# Patient Record
Sex: Male | Born: 1935 | Race: White | Hispanic: No | Marital: Married | Smoking: Never smoker
Health system: Southern US, Community
[De-identification: ages and names within clinical notes are randomized; demographics above are authoritative.]

## PROBLEM LIST (undated history)

## (undated) DIAGNOSIS — N200 Calculus of kidney: Secondary | ICD-10-CM

## (undated) DIAGNOSIS — H269 Unspecified cataract: Secondary | ICD-10-CM

## (undated) DIAGNOSIS — Z87442 Personal history of urinary calculi: Secondary | ICD-10-CM

## (undated) DIAGNOSIS — M1990 Unspecified osteoarthritis, unspecified site: Secondary | ICD-10-CM

## (undated) DIAGNOSIS — I10 Essential (primary) hypertension: Secondary | ICD-10-CM

## (undated) DIAGNOSIS — C61 Malignant neoplasm of prostate: Secondary | ICD-10-CM

## (undated) DIAGNOSIS — E785 Hyperlipidemia, unspecified: Secondary | ICD-10-CM

## (undated) DIAGNOSIS — E119 Type 2 diabetes mellitus without complications: Secondary | ICD-10-CM

## (undated) DIAGNOSIS — E039 Hypothyroidism, unspecified: Secondary | ICD-10-CM

## (undated) DIAGNOSIS — G629 Polyneuropathy, unspecified: Secondary | ICD-10-CM

## (undated) DIAGNOSIS — K4090 Unilateral inguinal hernia, without obstruction or gangrene, not specified as recurrent: Secondary | ICD-10-CM

## (undated) HISTORY — DX: Unspecified cataract: H26.9

## (undated) HISTORY — PX: KIDNEY STONE SURGERY: SHX686

## (undated) HISTORY — PX: VASECTOMY: SHX75

## (undated) HISTORY — PX: COLONOSCOPY: SHX174

## (undated) HISTORY — PX: INNER EAR SURGERY: SHX679

## (undated) HISTORY — PX: PROSTATE BIOPSY: SHX241

## (undated) HISTORY — PX: HERNIA REPAIR: SHX51

## (undated) HISTORY — PX: EYE SURGERY: SHX253

---

## 2000-01-14 ENCOUNTER — Emergency Department (HOSPITAL_COMMUNITY): Admission: EM | Admit: 2000-01-14 | Discharge: 2000-01-14 | Payer: Self-pay | Admitting: *Deleted

## 2000-08-22 ENCOUNTER — Encounter: Payer: Self-pay | Admitting: Specialist

## 2000-08-22 ENCOUNTER — Encounter: Admission: RE | Admit: 2000-08-22 | Discharge: 2000-08-22 | Payer: Self-pay | Admitting: Specialist

## 2007-05-07 ENCOUNTER — Ambulatory Visit: Payer: Self-pay | Admitting: Internal Medicine

## 2007-05-21 ENCOUNTER — Encounter: Payer: Self-pay | Admitting: Internal Medicine

## 2007-05-21 ENCOUNTER — Ambulatory Visit: Payer: Self-pay | Admitting: Internal Medicine

## 2008-03-27 ENCOUNTER — Emergency Department (HOSPITAL_COMMUNITY): Admission: EM | Admit: 2008-03-27 | Discharge: 2008-03-27 | Payer: Self-pay | Admitting: Emergency Medicine

## 2008-04-13 ENCOUNTER — Inpatient Hospital Stay (HOSPITAL_COMMUNITY): Admission: EM | Admit: 2008-04-13 | Discharge: 2008-04-14 | Payer: Self-pay | Admitting: Emergency Medicine

## 2009-10-11 DIAGNOSIS — G629 Polyneuropathy, unspecified: Secondary | ICD-10-CM

## 2009-10-11 DIAGNOSIS — E119 Type 2 diabetes mellitus without complications: Secondary | ICD-10-CM

## 2010-10-05 ENCOUNTER — Other Ambulatory Visit: Payer: Self-pay | Admitting: Dermatology

## 2011-01-22 NOTE — Op Note (Signed)
NAME:  Craig Jensen, VALVANO NO.:  [id number]   MEDICAL RECORD NO.:  [id number]          PATIENT TYPE:  INP   LOCATION:  1429                         FACILITY:  [facility]   PHYSICIAN:  Valetta Fuller, M.D.  DATE OF BIRTH:  03/02/1930   DATE OF PROCEDURE:  04/13/2008  DATE OF DISCHARGE:                               OPERATIVE REPORT   PREOPERATIVE DIAGNOSIS:  Left ureteral calculus.   POSTOPERATIVE DIAGNOSIS:  Left ureteral calculus.   PROCEDURE PERFORMED:  Cystoscopy, left retrograde pyelography, left  ureteroscopy, left-sided holmium laser lithotripsy, with left double-J  stent placement, and insertion of Foley catheter.   SURGEON:  Valetta Fuller, M.D.   ANESTHESIA:  General.   INDICATIONS:  [name] is a 77 year old male.  He generally enjoys  good health as had no major systemic medical illnesses, nor has he had  any previous urologic history.  The patient had presented to the [facility] Emergency Room approximately 2 weeks ago.  At that time, he had had  typical left-sided renal colic.  A CT scan demonstrated a 5 mm  calcification in his midureter.  The patient's pain resolved, and he was  discharged home.  That patient subsequently was asymptomatic for  approximately 2 weeks.  Yesterday, he developed recurrent severe renal  colic and re-presented to the [facility] Emergency Room.  There, his  pain was initially controlled but then became much more severe.  We saw  him in consultation.  At that time, he was having significant ongoing  nausea and significant renal colic.  For that reason, he was admitted  for observation.  We discussed options with him, and he wanted to  proceed with definitive management.  This morning, he was continuing to  have significant renal colic, at least intermittently.  We discussed the  pros and cons of a ureteroscopic approach, and he elected to proceed  with.  He appeared to understand the advantages and disadvantages and  potential complications.   TECHNIQUE AND FINDINGS:  The patient was brought to the operating room,  where he had successful induction of general anesthesia.  He was placed  in the lithotomy position and prepped and draped in the usual manner.  Cystoscopy revealed an unremarkable anterior urethra, with moderate  trilobar hyperplasia and a very high-riding median bar.  Cystoscopically, no other abnormalities were appreciated.  Left-sided  retrograde pyelogram was performed with an 8-French cone-tipped catheter  with fluoroscopic guidance.  A filling defect was noted in the distal  ureter just 1 cm or so above the ureterovesical junction.   A guidewire was all placed with some manipulation beyond the stone into  the left renal pelvis.  Initial attempts to engage the distal ureter  with the rigid ureteroscope were unsuccessful due to significant  narrowing and inflammation.  For that reason, over the guidewire we used  the inside portion of an access sheath to provide one-step dilation.  We  were then easily able to engage the ureteroscope, and a 4 x 6 mm stone  was encountered in the distal ureter.  Holmium laser lithotriptor was  used  to fragment the stone into approximately 5 pieces.  The largest 3-4  pieces were basket extracted and will be sent for analysis.  We elected  to place a 6-French 24 cm stent over a guidewire with fluoroscopic as  well as visual guidance.  The patient did have some ongoing hematuria,  primarily due to some bleeding from friable vessels at his median  bar/bladder neck.  For that reason, we went ahead and place  a Foley catheter, and his urine was light pink at the completion of the  procedure.  Lidocaine jelly was instilled, as was a B & O suppository.  No obvious complications occurred, and the patient appeared to tolerate  the procedure well.  He was brought to the recovery room in stable  condition.      Valetta Fuller, M.D.  Electronically  Signed     DSG/MEDQ  D:  04/13/2008  T:  04/13/2008  Job:  [id number]

## 2011-06-07 LAB — CBC
HCT: 43.2
HCT: 39.2
Hemoglobin: 14.5
Hemoglobin: 13.3
MCHC: 33.6
MCHC: 33.9
MCV: 92.3
MCV: 93.7
Platelets: 204
Platelets: 204
RBC: 4.68
RBC: 4.18 — ABNORMAL LOW
RDW: 13.4
RDW: 13.5
WBC: 9.2
WBC: 10.5

## 2011-06-07 LAB — POCT I-STAT, CHEM 8
BUN: 17
Calcium, Ion: 1.21
Chloride: 106
Creatinine, Ser: 1.1
Glucose, Bld: 193 — ABNORMAL HIGH
HCT: 43
Hemoglobin: 14.6
Potassium: 4.2
Sodium: 141
TCO2: 27

## 2011-06-07 LAB — URINALYSIS, ROUTINE W REFLEX MICROSCOPIC
Bilirubin Urine: NEGATIVE
Bilirubin Urine: NEGATIVE
Glucose, UA: NEGATIVE
Glucose, UA: NEGATIVE
Ketones, ur: >80 — AB
Nitrite: NEGATIVE
Nitrite: NEGATIVE
Protein, ur: NEGATIVE
Protein, ur: 30 — AB
Specific Gravity, Urine: 1.023
Specific Gravity, Urine: 1.024
Urobilinogen, UA: 0.2
Urobilinogen, UA: 0.2
pH: 5.5
pH: 8.5 — ABNORMAL HIGH

## 2011-06-07 LAB — BASIC METABOLIC PANEL
BUN: 19
BUN: 19
CO2: 24
CO2: 28
Calcium: 8.4
Calcium: 8.6
Chloride: 108
Chloride: 110
Creatinine, Ser: 1.24
Creatinine, Ser: 1.45
GFR calc Af Amer: 57 — ABNORMAL LOW
GFR calc Af Amer: >60
GFR calc non Af Amer: 47 — ABNORMAL LOW
GFR calc non Af Amer: 57 — ABNORMAL LOW
Glucose, Bld: 161 — ABNORMAL HIGH
Glucose, Bld: 164 — ABNORMAL HIGH
Potassium: 4.3
Potassium: 4.5
Sodium: 142
Sodium: 143

## 2011-06-07 LAB — URINE MICROSCOPIC-ADD ON

## 2011-06-07 LAB — DIFFERENTIAL
Basophils Absolute: 0
Basophils Relative: 0
Eosinophils Absolute: 0
Eosinophils Relative: 0
Lymphocytes Relative: 13
Lymphs Abs: 1.4
Monocytes Absolute: 1
Monocytes Relative: 10
Neutro Abs: 8.1 — ABNORMAL HIGH
Neutrophils Relative %: 77

## 2012-05-07 ENCOUNTER — Encounter: Payer: Self-pay | Admitting: Internal Medicine

## 2012-12-28 ENCOUNTER — Encounter: Payer: Self-pay | Admitting: Physician Assistant

## 2012-12-28 ENCOUNTER — Ambulatory Visit (INDEPENDENT_AMBULATORY_CARE_PROVIDER_SITE_OTHER): Payer: Medicare Other | Admitting: Emergency Medicine

## 2012-12-28 VITALS — BP 158/76 | HR 70 | Temp 98.1°F | Resp 18 | Ht 71.5 in | Wt 226.0 lb

## 2012-12-28 DIAGNOSIS — S0990XA Unspecified injury of head, initial encounter: Secondary | ICD-10-CM

## 2012-12-28 DIAGNOSIS — S0101XA Laceration without foreign body of scalp, initial encounter: Secondary | ICD-10-CM

## 2012-12-28 DIAGNOSIS — S0100XA Unspecified open wound of scalp, initial encounter: Secondary | ICD-10-CM

## 2012-12-28 NOTE — Progress Notes (Signed)
Patient ID: JIBRAN CROOKSHANKS MRN: [id number], DOB: Feb 23, 1930, 79 y.o. Date of Encounter: 12/28/2012, 12:29 PM   PROCEDURE NOTE: Verbal consent obtained. Sterile technique employed. Numbing: Anesthesia obtained with 1% lidocaine with epinephrine.   Cleansed with soap and water. Irrigated.  Wound explored, no deep structures involved, no foreign bodies.   Wound repaired with # 7 SI and #1 purse-string suture.   Hemostasis obtained. Wound cleansed and dressed.  Wound care instructions including precautions covered with patient. Handout given.  Anticipate suture removal in 7-10 days  Rhoderick Moody, PA-C 12/28/2012 12:29 PM

## 2012-12-28 NOTE — Patient Instructions (Signed)

## 2012-12-28 NOTE — Progress Notes (Signed)
[facility] [address] [place] [address] [phone]  Date:  12/28/2012   Name:  Craig Jensen   DOB:  1929-10-20   MRN:  [id number]  PCP:  Minda Meo, MD    Chief Complaint: wound head   History of Present Illness:  Craig Jensen is a 79 y.o. very pleasant male patient who presents with the following:  Standing in a parking lot pulling on a steel post that was embedded in the ground.  The post gave way suddenly and he fell backwards striking his head on the pavement.  He had no LOC.  No neuro or visual symptoms.  Not on anticoagulant.  Not certain about tetanus status.  Denies other complaint or health concern today.   There is no problem list on file for this patient.   Past Medical History  Diagnosis Date  . Cataract     Past Surgical History  Procedure Laterality Date  . Eye surgery    . Hernia repair    . Joint replacement    . Vasectomy      History  Substance Use Topics  . Smoking status: Never Smoker   . Smokeless tobacco: Not on file  . Alcohol Use: No    Family History  Problem Relation Age of Onset  . Cancer Mother   . Stroke Mother   . Heart disease Father   . Stroke Brother     No Known Allergies  Medication list has been reviewed and updated.  No current outpatient prescriptions on file prior to visit.   No current facility-administered medications on file prior to visit.    Review of Systems:  As per HPI, otherwise negative.    Physical Examination: Filed Vitals:   12/28/12 1133  BP: 158/76  Pulse: 70  Temp: 98.1 F (36.7 C)  Resp: 18   Filed Vitals:   12/28/12 1133  Height: 5' 11.5" (1.816 m)  Weight: 226 lb (102.513 kg)   Body mass index is 31.08 kg/(m^2). Ideal Body Weight: Weight in (lb) to have BMI = 25: 181.4   GEN: WDWN, NAD, Non-toxic, Alert & Oriented x 3 HEENT: laceration occiput, stellate.  No FB.  , Normocephalic.   PRRERLA EOMI BATTLE Negative.   Ears and Nose:  No external deformity. EXTR: No clubbing/cyanosis/edema NEURO: Normal gait.  PSYCH: Normally interactive. Conversant. Not depressed or anxious appearing.  Calm demeanor.  Neck supple not tender  Assessment and Plan: Laceration scalp  Signed,  Phillips Odor, MD

## 2013-01-05 ENCOUNTER — Ambulatory Visit (INDEPENDENT_AMBULATORY_CARE_PROVIDER_SITE_OTHER): Payer: Medicare Other | Admitting: Internal Medicine

## 2013-01-05 VITALS — BP 178/93 | HR 88 | Temp 98.4°F | Resp 18

## 2013-01-05 DIAGNOSIS — R519 Headache, unspecified: Secondary | ICD-10-CM

## 2013-01-05 DIAGNOSIS — R51 Headache: Secondary | ICD-10-CM

## 2013-01-05 DIAGNOSIS — S0100XD Unspecified open wound of scalp, subsequent encounter: Secondary | ICD-10-CM

## 2013-01-05 DIAGNOSIS — Z5189 Encounter for other specified aftercare: Secondary | ICD-10-CM

## 2013-01-05 NOTE — Progress Notes (Signed)
  Subjective:    Patient ID: Craig Jensen, male    DOB: 1930/04/06, 79 y.o.   MRN: [id number]  HPI Tender wound on scalp.   Review of Systems     Objective:   Physical Exam  Wound dry and healed but tender      Assessment & Plan:  Wound healed/Remove sutures Pain care

## 2013-01-07 ENCOUNTER — Encounter: Payer: Self-pay | Admitting: Internal Medicine

## 2015-10-29 ENCOUNTER — Emergency Department (HOSPITAL_BASED_OUTPATIENT_CLINIC_OR_DEPARTMENT_OTHER)
Admission: EM | Admit: 2015-10-29 | Discharge: 2015-10-29 | Disposition: A | Discharge status: Home / Self Care | Payer: Medicare Other | Attending: Emergency Medicine | Admitting: Emergency Medicine

## 2015-10-29 ENCOUNTER — Encounter (HOSPITAL_BASED_OUTPATIENT_CLINIC_OR_DEPARTMENT_OTHER): Payer: Self-pay | Admitting: Emergency Medicine

## 2015-10-29 ENCOUNTER — Emergency Department (HOSPITAL_BASED_OUTPATIENT_CLINIC_OR_DEPARTMENT_OTHER): Payer: Medicare Other

## 2015-10-29 DIAGNOSIS — W1843XA Slipping, tripping and stumbling without falling due to stepping from one level to another, initial encounter: Secondary | ICD-10-CM | POA: Diagnosis not present

## 2015-10-29 DIAGNOSIS — Z8669 Personal history of other diseases of the nervous system and sense organs: Secondary | ICD-10-CM | POA: Diagnosis not present

## 2015-10-29 DIAGNOSIS — S79911A Unspecified injury of right hip, initial encounter: Secondary | ICD-10-CM | POA: Diagnosis present

## 2015-10-29 DIAGNOSIS — Y93K1 Activity, walking an animal: Secondary | ICD-10-CM | POA: Insufficient documentation

## 2015-10-29 DIAGNOSIS — S79912A Unspecified injury of left hip, initial encounter: Secondary | ICD-10-CM | POA: Diagnosis not present

## 2015-10-29 DIAGNOSIS — Y9289 Other specified places as the place of occurrence of the external cause: Secondary | ICD-10-CM | POA: Insufficient documentation

## 2015-10-29 DIAGNOSIS — M5431 Sciatica, right side: Secondary | ICD-10-CM | POA: Diagnosis not present

## 2015-10-29 DIAGNOSIS — Y998 Other external cause status: Secondary | ICD-10-CM | POA: Diagnosis not present

## 2015-10-29 HISTORY — DX: Polyneuropathy, unspecified: G62.9

## 2015-10-29 HISTORY — DX: Type 2 diabetes mellitus without complications: E11.9

## 2015-10-29 HISTORY — DX: Hyperlipidemia, unspecified: E78.5

## 2015-10-29 HISTORY — DX: Unilateral inguinal hernia, without obstruction or gangrene, not specified as recurrent: K40.90

## 2015-10-29 HISTORY — DX: Unspecified osteoarthritis, unspecified site: M19.90

## 2015-10-29 MED ORDER — PREDNISONE 10 MG PO TABS: 20.0000 mg | ORAL_TABLET | Freq: Two times a day (BID) | ORAL | Status: DC

## 2015-10-29 MED ORDER — HYDROCODONE-ACETAMINOPHEN 5-325 MG PO TABS
2.0000 | ORAL_TABLET | Freq: Once | ORAL | Status: AC
  Administered 2015-10-29: 2 via ORAL
  Filled 2015-10-29: qty 2

## 2015-10-29 MED ORDER — HYDROCODONE-ACETAMINOPHEN 5-325 MG PO TABS: 1.0000 | ORAL_TABLET | Freq: Four times a day (QID) | ORAL | Status: DC | PRN

## 2015-10-29 NOTE — ED Notes (Signed)
MD with pt  

## 2015-10-29 NOTE — ED Notes (Signed)
MD at bedside. 

## 2015-10-29 NOTE — ED Notes (Signed)
Pain in right hip x2 days.  Sts he stepped in a depression on the ground 10 days ago while walking the dog and he had pain in his LEFT hip for a week.  Two days ago he started having severe pain in his RIGHT hip that is worse than the left hip pain was and is keeping him up at night.

## 2015-10-29 NOTE — Discharge Instructions (Signed)
Prednisone as prescribed.  Hydrocodone as prescribed as needed for pain.  Follow-up with your primary Dr. if not improving in the next week, and return to the ER if symptoms significantly worsen or change.   Sciatica Sciatica is pain, weakness, numbness, or tingling along the path of the sciatic nerve. The nerve starts in the lower back and runs down the back of each leg. The nerve controls the muscles in the lower leg and in the back of the knee, while also providing sensation to the back of the thigh, lower leg, and the sole of your foot. Sciatica is a symptom of another medical condition. For instance, nerve damage or certain conditions, such as a herniated disk or bone spur on the spine, pinch or put pressure on the sciatic nerve. This causes the pain, weakness, or other sensations normally associated with sciatica. Generally, sciatica only affects one side of the body. CAUSES   Herniated or slipped disc.  Degenerative disk disease.  A pain disorder involving the narrow muscle in the buttocks (piriformis syndrome).  Pelvic injury or fracture.  Pregnancy.  Tumor (rare). SYMPTOMS  Symptoms can vary from mild to very severe. The symptoms usually travel from the low back to the buttocks and down the back of the leg. Symptoms can include:  Mild tingling or dull aches in the lower back, leg, or hip.  Numbness in the back of the calf or sole of the foot.  Burning sensations in the lower back, leg, or hip.  Sharp pains in the lower back, leg, or hip.  Leg weakness.  Severe back pain inhibiting movement. These symptoms may get worse with coughing, sneezing, laughing, or prolonged sitting or standing. Also, being overweight may worsen symptoms. DIAGNOSIS  Your caregiver will perform a physical exam to look for common symptoms of sciatica. He or she may ask you to do certain movements or activities that would trigger sciatic nerve pain. Other tests may be performed to find the cause of  the sciatica. These may include:  Blood tests.  X-rays.  Imaging tests, such as an MRI or CT scan. TREATMENT  Treatment is directed at the cause of the sciatic pain. Sometimes, treatment is not necessary and the pain and discomfort goes away on its own. If treatment is needed, your caregiver may suggest:  Over-the-counter medicines to relieve pain.  Prescription medicines, such as anti-inflammatory medicine, muscle relaxants, or narcotics.  Applying heat or ice to the painful area.  Steroid injections to lessen pain, irritation, and inflammation around the nerve.  Reducing activity during periods of pain.  Exercising and stretching to strengthen your abdomen and improve flexibility of your spine. Your caregiver may suggest losing weight if the extra weight makes the back pain worse.  Physical therapy.  Surgery to eliminate what is pressing or pinching the nerve, such as a bone spur or part of a herniated disk. HOME CARE INSTRUCTIONS   Only take over-the-counter or prescription medicines for pain or discomfort as directed by your caregiver.  Apply ice to the affected area for 20 minutes, 3-4 times a day for the first 48-72 hours. Then try heat in the same way.  Exercise, stretch, or perform your usual activities if these do not aggravate your pain.  Attend physical therapy sessions as directed by your caregiver.  Keep all follow-up appointments as directed by your caregiver.  Do not wear high heels or shoes that do not provide proper support.  Check your mattress to see if it is too  soft. A firm mattress may lessen your pain and discomfort. SEEK IMMEDIATE MEDICAL CARE IF:   You lose control of your bowel or bladder (incontinence).  You have increasing weakness in the lower back, pelvis, buttocks, or legs.  You have redness or swelling of your back.  You have a burning sensation when you urinate.  You have pain that gets worse when you lie down or awakens you at  night.  Your pain is worse than you have experienced in the past.  Your pain is lasting longer than 4 weeks.  You are suddenly losing weight without reason. MAKE SURE YOU:  Understand these instructions.  Will watch your condition.  Will get help right away if you are not doing well or get worse.   This information is not intended to replace advice given to you by your health care provider. Make sure you discuss any questions you have with your health care provider.   Document Released: 08/20/2001 Document Revised: 05/17/2015 Document Reviewed: 01/05/2012 Elsevier Interactive Patient Education [employer].

## 2015-10-29 NOTE — ED Provider Notes (Signed)
CSN: [id number]     Arrival date & time 10/29/15  0402 History   First MD Initiated Contact with Patient 10/29/15 [phone]     Chief Complaint  Patient presents with  . Hip Pain     (Consider location/radiation/quality/duration/timing/severity/associated sxs/prior Treatment) HPI Comments: Patient is an 82 year old male with past medical history of arthritis and cataracts. He presents for evaluation of right hip pain. He reports still pretty awkwardly on uneven ground one week ago. He had pain in his left hip which gradually improved, Justice and is the left hip pain was improving he began experiencing worsening pain in his right hip. He denies any new injury or trauma. He is able to ambulate, however is having discomfort. He reports radiation to the back of his leg but denies any weakness, numbness, or bowel or bladder complaints.  Patient is a 82 y.o. male presenting with hip pain. The history is provided by the patient.  Hip Pain This is a new problem. The current episode started 2 days ago. The problem occurs constantly. The problem has been rapidly worsening. The symptoms are aggravated by walking. Nothing relieves the symptoms. Treatments tried: Ibuprofen. The treatment provided mild relief.    Past Medical History  Diagnosis Date  . Cataract   . Arthritis    Past Surgical History  Procedure Laterality Date  . Eye surgery    . Hernia repair    . Joint replacement    . Vasectomy     Family History  Problem Relation Age of Onset  . Cancer Mother   . Stroke Mother   . Heart disease Father   . Stroke Brother    Social History  Substance Use Topics  . Smoking status: Never Smoker   . Smokeless tobacco: None  . Alcohol Use: No    Review of Systems  All other systems reviewed and are negative.     Allergies  Review of patient's allergies indicates no known allergies.  Home Medications   Prior to Admission medications   Not on File   BP 208/94 mmHg  Pulse 64   Temp(Src) 98.3 F (36.8 C) (Oral)  Resp 16  Ht 6\' 1"  (1.854 m)  Wt 220 lb (99.791 kg)  BMI 29.03 kg/m2  SpO2 98% Physical Exam  Constitutional: He is oriented to person, place, and time. He appears well-developed and well-nourished. No distress.  HENT:  Head: Normocephalic and atraumatic.  Neck: Normal range of motion. Neck supple.  Musculoskeletal:  The right hip appears grossly normal. There is no shortening or rotation of the right leg. There is tenderness in the right buttock area.  Neurological: He is alert and oriented to person, place, and time.  Strength is 5 out of 5 in the bilateral lower extremities. He is ambulatory with discomfort.  Skin: Skin is warm and dry. He is not diaphoretic.  Nursing note and vitals reviewed.   ED Course  Procedures (including critical care time) Labs Review Labs Reviewed - No data to display  Imaging Review No results found. I have personally reviewed and evaluated these images and lab results as part of my medical decision-making.    MDM   Final diagnoses:  None    Patient presents here with pain in his right hip. His tender to palpation over the buttock. I highly suspect this is sciatica. Was given hydrocodone without much relief. He will be discharged with prednisone and hydrocodone. His x-rays are negative.    Veryl Speak, MD 10/29/15 [phone]

## 2015-11-28 ENCOUNTER — Other Ambulatory Visit: Payer: Self-pay | Admitting: Internal Medicine

## 2015-11-28 DIAGNOSIS — G959 Disease of spinal cord, unspecified: Secondary | ICD-10-CM

## 2015-12-05 ENCOUNTER — Other Ambulatory Visit: Payer: Self-pay | Admitting: Internal Medicine

## 2015-12-05 ENCOUNTER — Other Ambulatory Visit: Payer: Self-pay | Admitting: Orthopedic Surgery

## 2015-12-05 DIAGNOSIS — M19011 Primary osteoarthritis, right shoulder: Secondary | ICD-10-CM

## 2015-12-05 DIAGNOSIS — M545 Low back pain: Secondary | ICD-10-CM

## 2015-12-07 ENCOUNTER — Other Ambulatory Visit: Payer: Medicare Other

## 2015-12-08 ENCOUNTER — Ambulatory Visit
Admission: RE | Admit: 2015-12-08 | Discharge: 2015-12-08 | Disposition: A | Discharge status: Home / Self Care | Payer: Medicare Other | Source: Ambulatory Visit | Attending: Orthopedic Surgery | Admitting: Orthopedic Surgery

## 2015-12-08 ENCOUNTER — Ambulatory Visit
Admission: RE | Admit: 2015-12-08 | Discharge: 2015-12-08 | Disposition: A | Discharge status: Home / Self Care | Payer: Medicare Other | Source: Ambulatory Visit | Attending: Internal Medicine | Admitting: Internal Medicine

## 2015-12-08 DIAGNOSIS — M545 Low back pain: Secondary | ICD-10-CM

## 2015-12-08 DIAGNOSIS — M19011 Primary osteoarthritis, right shoulder: Secondary | ICD-10-CM

## 2016-01-05 ENCOUNTER — Other Ambulatory Visit: Payer: Self-pay

## 2016-01-05 ENCOUNTER — Encounter (HOSPITAL_COMMUNITY)
Admission: RE | Admit: 2016-01-05 | Discharge: 2016-01-05 | Disposition: A | Discharge status: Home / Self Care | Payer: Medicare Other | Source: Ambulatory Visit | Attending: Orthopedic Surgery | Admitting: Orthopedic Surgery

## 2016-01-05 ENCOUNTER — Other Ambulatory Visit (HOSPITAL_COMMUNITY): Payer: Self-pay | Admitting: *Deleted

## 2016-01-05 ENCOUNTER — Encounter (HOSPITAL_COMMUNITY): Payer: Self-pay

## 2016-01-05 DIAGNOSIS — M13811 Other specified arthritis, right shoulder: Secondary | ICD-10-CM | POA: Diagnosis not present

## 2016-01-05 DIAGNOSIS — Z01812 Encounter for preprocedural laboratory examination: Secondary | ICD-10-CM | POA: Diagnosis not present

## 2016-01-05 DIAGNOSIS — E119 Type 2 diabetes mellitus without complications: Secondary | ICD-10-CM | POA: Insufficient documentation

## 2016-01-05 DIAGNOSIS — Z01818 Encounter for other preprocedural examination: Secondary | ICD-10-CM | POA: Insufficient documentation

## 2016-01-05 HISTORY — DX: Calculus of kidney: N20.0

## 2016-01-05 LAB — CBC
HCT: 42.2 % (ref 39.0–52.0)
Hemoglobin: 13.8 g/dL (ref 13.0–17.0)
MCH: 30.2 pg (ref 26.0–34.0)
MCHC: 32.7 g/dL (ref 30.0–36.0)
MCV: 92.3 fL (ref 78.0–100.0)
Platelets: 215 10*3/uL (ref 150–400)
RBC: 4.57 MIL/uL (ref 4.22–5.81)
RDW: 13.6 % (ref 11.5–15.5)
WBC: 7.4 10*3/uL (ref 4.0–10.5)

## 2016-01-05 LAB — BASIC METABOLIC PANEL
Anion gap: 7 (ref 5–15)
BUN: 15 mg/dL (ref 6–20)
CO2: 27 mmol/L (ref 22–32)
Calcium: 9.4 mg/dL (ref 8.9–10.3)
Chloride: 107 mmol/L (ref 101–111)
Creatinine, Ser: 0.85 mg/dL (ref 0.61–1.24)
GFR calc Af Amer: >60 mL/min (ref >60)
GFR calc non Af Amer: >60 mL/min (ref >60)
Glucose, Bld: 177 mg/dL — ABNORMAL HIGH (ref 65–99)
Potassium: 4.5 mmol/L (ref 3.5–5.1)
Sodium: 141 mmol/L (ref 135–145)

## 2016-01-05 LAB — SURGICAL PCR SCREEN
MRSA, PCR: NEGATIVE
Staphylococcus aureus: NEGATIVE

## 2016-01-05 NOTE — Progress Notes (Addendum)
Pt denies cardiac history, chest pain or sob.   Denies ever having an Echo, cath or stress test done.  Pt's PCP is Dr. Reynaldo Minium. Pt is diabetic - he states "borderline". Last A1C was 7.1 on 11/22/15. States fasting blood sugar is usually around 150  Medical Clearance from Dr. Reynaldo Minium is in chart.

## 2016-01-05 NOTE — Pre-Procedure Instructions (Signed)
Craig Jensen  01/05/2016     Your procedure is scheduled on Thursday, Jan 11, 2016 at 10:35 AM.   Report to [facility] Entrance "A" Admitting Office at 8:30 AM.   Call this number if you have problems the morning of surgery: [phone]   Any questions prior to day of surgery, please call [phone] between 8 & 4 PM.   Remember:  Do not eat food or drink liquids after midnight Wednesday, 01/10/16.    Do not wear jewelry.  Do not wear lotions, powders, or cologne.  You may NOT wear deodorant.  Do not shave 48 hours prior to surgery.  Men may shave face and neck.  Do not bring valuables to the hospital.  [facility] is not responsible for any belongings or valuables.  Contacts, dentures or bridgework may not be worn into surgery.  Leave your suitcase in the car.  After surgery it may be brought to your room.  For patients admitted to the hospital, discharge time will be determined by your treatment team.  Special instructions:  [name] - Preparing for Surgery  Before surgery, you can play an important role.  Because skin is not sterile, your skin needs to be as free of germs as possible.  You can reduce the number of germs on you skin by washing with CHG (chlorahexidine gluconate) soap before surgery.  CHG is an antiseptic cleaner which kills germs and bonds with the skin to continue killing germs even after washing.  Please DO NOT use if you have an allergy to CHG or antibacterial soaps.  If your skin becomes reddened/irritated stop using the CHG and inform your nurse when you arrive at Short Stay.  Do not shave (including legs and underarms) for at least 48 hours prior to the first CHG shower.  You may shave your face.  Please follow these instructions carefully:   1.  Shower with CHG Soap the night before surgery and the                                morning of Surgery.  2.  If you choose to wash your hair, wash your hair first as usual with your        normal shampoo.  3.  After you shampoo, rinse your hair and body thoroughly to remove the                      Shampoo.  4.  Use CHG as you would any other liquid soap.  You can apply chg directly       to the skin and wash gently with scrungie or a clean washcloth.  5.  Apply the CHG Soap to your body ONLY FROM THE NECK DOWN.        Do not use on open wounds or open sores.  Avoid contact with your eyes, ears, mouth and genitals (private parts).  Wash genitals (private parts) with your normal soap.  6.  Wash thoroughly, paying special attention to the area where your surgery        will be performed.  7.  Thoroughly rinse your body with warm water from the neck down.  8.  DO NOT shower/wash with your normal soap after using and rinsing off       the CHG Soap.  9.  Pat yourself dry with a clean towel.  10.  Wear clean pajamas.            11.  Place clean sheets on your bed the night of your first shower and do not        sleep with pets.  Day of Surgery  Do not apply any lotions/deodorants the morning of surgery.  Please wear clean clothes to the hospital.   Please read over the following fact sheets that you were given. Pain Booklet, Coughing and Deep Breathing, MRSA Information and Surgical Site Infection Prevention

## 2016-01-10 MED ORDER — TRANEXAMIC ACID 1000 MG/10ML IV SOLN
1000.0000 mg | INTRAVENOUS | Status: AC
  Administered 2016-01-11: 1000 mg via INTRAVENOUS
  Filled 2016-01-10: qty 10

## 2016-01-11 ENCOUNTER — Inpatient Hospital Stay (HOSPITAL_COMMUNITY)
Admission: RE | Admit: 2016-01-11 | Discharge: 2016-01-12 | DRG: 483 | Disposition: A | Discharge status: Home Health | Payer: Medicare Other | Source: Ambulatory Visit | Attending: Orthopedic Surgery | Admitting: Orthopedic Surgery

## 2016-01-11 ENCOUNTER — Encounter (HOSPITAL_COMMUNITY)
Admission: RE | Disposition: A | Discharge status: Home Health | Payer: Self-pay | Source: Ambulatory Visit | Attending: Orthopedic Surgery

## 2016-01-11 ENCOUNTER — Inpatient Hospital Stay (HOSPITAL_COMMUNITY): Payer: Medicare Other | Admitting: Anesthesiology

## 2016-01-11 ENCOUNTER — Encounter (HOSPITAL_COMMUNITY): Payer: Self-pay | Admitting: Surgery

## 2016-01-11 DIAGNOSIS — E1142 Type 2 diabetes mellitus with diabetic polyneuropathy: Secondary | ICD-10-CM | POA: Diagnosis present

## 2016-01-11 DIAGNOSIS — Z961 Presence of intraocular lens: Secondary | ICD-10-CM | POA: Diagnosis present

## 2016-01-11 DIAGNOSIS — E785 Hyperlipidemia, unspecified: Secondary | ICD-10-CM | POA: Diagnosis present

## 2016-01-11 DIAGNOSIS — Z96611 Presence of right artificial shoulder joint: Secondary | ICD-10-CM

## 2016-01-11 DIAGNOSIS — Z96619 Presence of unspecified artificial shoulder joint: Secondary | ICD-10-CM

## 2016-01-11 DIAGNOSIS — M75101 Unspecified rotator cuff tear or rupture of right shoulder, not specified as traumatic: Principal | ICD-10-CM | POA: Diagnosis present

## 2016-01-11 HISTORY — PX: REVERSE SHOULDER ARTHROPLASTY: SHX5054

## 2016-01-11 LAB — GLUCOSE, CAPILLARY
Glucose-Capillary: 155 mg/dL — ABNORMAL HIGH (ref 65–99)
Glucose-Capillary: 135 mg/dL — ABNORMAL HIGH (ref 65–99)
Glucose-Capillary: 166 mg/dL — ABNORMAL HIGH (ref 65–99)
Glucose-Capillary: 211 mg/dL — ABNORMAL HIGH (ref 65–99)

## 2016-01-11 SURGERY — ARTHROPLASTY, SHOULDER, TOTAL, REVERSE
Anesthesia: General | Site: Shoulder | Laterality: Right

## 2016-01-11 MED ORDER — MAGNESIUM CITRATE PO SOLN: 1.0000 | Freq: Once | ORAL | Status: DC | PRN

## 2016-01-11 MED ORDER — ONDANSETRON HCL 4 MG/2ML IJ SOLN
INTRAMUSCULAR | Status: AC
  Filled 2016-01-11: qty 2

## 2016-01-11 MED ORDER — FENTANYL CITRATE (PF) 100 MCG/2ML IJ SOLN
INTRAMUSCULAR | Status: DC | PRN
  Administered 2016-01-11: 50 ug via INTRAVENOUS
  Administered 2016-01-11: 150 ug via INTRAVENOUS

## 2016-01-11 MED ORDER — FENTANYL CITRATE (PF) 250 MCG/5ML IJ SOLN
INTRAMUSCULAR | Status: AC
  Filled 2016-01-11: qty 5

## 2016-01-11 MED ORDER — PHENOL 1.4 % MT LIQD: 1.0000 | OROMUCOSAL | Status: DC | PRN

## 2016-01-11 MED ORDER — LIDOCAINE HCL (CARDIAC) 20 MG/ML IV SOLN
INTRAVENOUS | Status: DC | PRN
  Administered 2016-01-11: 100 mg via INTRAVENOUS

## 2016-01-11 MED ORDER — ALUM & MAG HYDROXIDE-SIMETH 200-200-20 MG/5ML PO SUSP: 30.0000 mL | ORAL | Status: DC | PRN

## 2016-01-11 MED ORDER — CHLORHEXIDINE GLUCONATE 4 % EX LIQD: 60.0000 mL | Freq: Once | CUTANEOUS | Status: DC

## 2016-01-11 MED ORDER — HYDROMORPHONE HCL 1 MG/ML IJ SOLN: 0.5000 mg | INTRAMUSCULAR | Status: DC | PRN

## 2016-01-11 MED ORDER — FENTANYL CITRATE (PF) 100 MCG/2ML IJ SOLN
INTRAMUSCULAR | Status: AC
  Administered 2016-01-11: 50 ug via INTRAVENOUS
  Filled 2016-01-11: qty 2

## 2016-01-11 MED ORDER — HYDROCODONE-ACETAMINOPHEN 5-325 MG PO TABS: 1.0000 | ORAL_TABLET | ORAL | Status: DC | PRN

## 2016-01-11 MED ORDER — FENTANYL CITRATE (PF) 100 MCG/2ML IJ SOLN
50.0000 ug | Freq: Once | INTRAMUSCULAR | Status: AC
  Administered 2016-01-11: 50 ug via INTRAVENOUS

## 2016-01-11 MED ORDER — PROPOFOL 10 MG/ML IV BOLUS
INTRAVENOUS | Status: DC | PRN
  Administered 2016-01-11: 100 mg via INTRAVENOUS

## 2016-01-11 MED ORDER — METOCLOPRAMIDE HCL 5 MG/ML IJ SOLN: 5.0000 mg | Freq: Three times a day (TID) | INTRAMUSCULAR | Status: DC | PRN

## 2016-01-11 MED ORDER — ACETAMINOPHEN 325 MG PO TABS
650.0000 mg | ORAL_TABLET | Freq: Four times a day (QID) | ORAL | Status: DC | PRN
  Administered 2016-01-12 (×2): 650 mg via ORAL
  Filled 2016-01-11 (×2): qty 2

## 2016-01-11 MED ORDER — ONDANSETRON HCL 4 MG/2ML IJ SOLN
INTRAMUSCULAR | Status: DC | PRN
  Administered 2016-01-11: 4 mg via INTRAVENOUS

## 2016-01-11 MED ORDER — ACETAMINOPHEN 650 MG RE SUPP: 650.0000 mg | Freq: Four times a day (QID) | RECTAL | Status: DC | PRN

## 2016-01-11 MED ORDER — MENTHOL 3 MG MT LOZG: 1.0000 | LOZENGE | OROMUCOSAL | Status: DC | PRN

## 2016-01-11 MED ORDER — ONDANSETRON HCL 4 MG PO TABS: 4.0000 mg | ORAL_TABLET | Freq: Four times a day (QID) | ORAL | Status: DC | PRN

## 2016-01-11 MED ORDER — SODIUM CHLORIDE 0.9 % IR SOLN
Status: DC | PRN
Start: 2016-01-11 — End: 2016-01-11
  Administered 2016-01-11: 1000 mL

## 2016-01-11 MED ORDER — FENTANYL CITRATE (PF) 100 MCG/2ML IJ SOLN: 25.0000 ug | INTRAMUSCULAR | Status: DC | PRN

## 2016-01-11 MED ORDER — BUPIVACAINE-EPINEPHRINE (PF) 0.5% -1:200000 IJ SOLN
INTRAMUSCULAR | Status: DC | PRN
  Administered 2016-01-11: 20 mL via PERINEURAL

## 2016-01-11 MED ORDER — KETOROLAC TROMETHAMINE 15 MG/ML IJ SOLN
7.5000 mg | Freq: Four times a day (QID) | INTRAMUSCULAR | Status: AC
  Administered 2016-01-11 – 2016-01-12 (×3): 7.5 mg via INTRAVENOUS
  Filled 2016-01-11 (×3): qty 1

## 2016-01-11 MED ORDER — GLYCOPYRROLATE 0.2 MG/ML IV SOSY
PREFILLED_SYRINGE | INTRAVENOUS | Status: AC
  Filled 2016-01-11: qty 3

## 2016-01-11 MED ORDER — LACTATED RINGERS IV SOLN
INTRAVENOUS | Status: DC
  Administered 2016-01-11: 09:00:00 via INTRAVENOUS

## 2016-01-11 MED ORDER — DOCUSATE SODIUM 100 MG PO CAPS
100.0000 mg | ORAL_CAPSULE | Freq: Two times a day (BID) | ORAL | Status: DC
  Administered 2016-01-11 (×2): 100 mg via ORAL
  Filled 2016-01-11 (×3): qty 1

## 2016-01-11 MED ORDER — CEFAZOLIN SODIUM-DEXTROSE 2-4 GM/100ML-% IV SOLN
2.0000 g | INTRAVENOUS | Status: AC
  Administered 2016-01-11: 2 g via INTRAVENOUS
  Filled 2016-01-11: qty 100

## 2016-01-11 MED ORDER — CEFAZOLIN SODIUM-DEXTROSE 2-4 GM/100ML-% IV SOLN
2.0000 g | Freq: Four times a day (QID) | INTRAVENOUS | Status: AC
  Administered 2016-01-11 – 2016-01-12 (×3): 2 g via INTRAVENOUS
  Filled 2016-01-11 (×4): qty 100

## 2016-01-11 MED ORDER — PHENYLEPHRINE HCL 10 MG/ML IJ SOLN
INTRAMUSCULAR | Status: DC | PRN
  Administered 2016-01-11: 80 ug via INTRAVENOUS
  Administered 2016-01-11: 40 ug via INTRAVENOUS
  Administered 2016-01-11: 120 ug via INTRAVENOUS
  Administered 2016-01-11: 80 ug via INTRAVENOUS

## 2016-01-11 MED ORDER — LACTATED RINGERS IV SOLN: INTRAVENOUS | Status: DC

## 2016-01-11 MED ORDER — ONDANSETRON HCL 4 MG/2ML IJ SOLN: 4.0000 mg | Freq: Four times a day (QID) | INTRAMUSCULAR | Status: DC | PRN

## 2016-01-11 MED ORDER — ROCURONIUM BROMIDE 50 MG/5ML IV SOLN
INTRAVENOUS | Status: AC
  Filled 2016-01-11: qty 1

## 2016-01-11 MED ORDER — MIDAZOLAM HCL 2 MG/2ML IJ SOLN
INTRAMUSCULAR | Status: DC
Start: 2016-01-11 — End: 2016-01-11
  Filled 2016-01-11: qty 2

## 2016-01-11 MED ORDER — ONDANSETRON HCL 4 MG/2ML IJ SOLN: 4.0000 mg | Freq: Once | INTRAMUSCULAR | Status: DC | PRN

## 2016-01-11 MED ORDER — BISACODYL 5 MG PO TBEC: 5.0000 mg | DELAYED_RELEASE_TABLET | Freq: Every day | ORAL | Status: DC | PRN

## 2016-01-11 MED ORDER — GLYCOPYRROLATE 0.2 MG/ML IJ SOLN
INTRAMUSCULAR | Status: DC | PRN
  Administered 2016-01-11: 0.4 mg via INTRAVENOUS

## 2016-01-11 MED ORDER — NEOSTIGMINE METHYLSULFATE 10 MG/10ML IV SOLN
INTRAVENOUS | Status: DC | PRN
  Administered 2016-01-11: 3 mg via INTRAVENOUS

## 2016-01-11 MED ORDER — ROCURONIUM BROMIDE 100 MG/10ML IV SOLN
INTRAVENOUS | Status: DC | PRN
  Administered 2016-01-11: 50 mg via INTRAVENOUS

## 2016-01-11 MED ORDER — PROPOFOL 10 MG/ML IV BOLUS
INTRAVENOUS | Status: AC
  Filled 2016-01-11: qty 20

## 2016-01-11 MED ORDER — NEOSTIGMINE METHYLSULFATE 5 MG/5ML IV SOSY
PREFILLED_SYRINGE | INTRAVENOUS | Status: AC
  Filled 2016-01-11: qty 5

## 2016-01-11 MED ORDER — SODIUM CHLORIDE 0.9 % IV SOLN
10.0000 mg | INTRAVENOUS | Status: DC | PRN
  Administered 2016-01-11: 25 ug/min via INTRAVENOUS

## 2016-01-11 MED ORDER — POLYETHYLENE GLYCOL 3350 17 G PO PACK: 17.0000 g | PACK | Freq: Every day | ORAL | Status: DC | PRN

## 2016-01-11 MED ORDER — METOCLOPRAMIDE HCL 5 MG PO TABS: 5.0000 mg | ORAL_TABLET | Freq: Three times a day (TID) | ORAL | Status: DC | PRN

## 2016-01-11 SURGICAL SUPPLY — 69 items
ADH SKN CLS APL DERMABOND .7 (GAUZE/BANDAGES/DRESSINGS) ×1
AID PSTN UNV HD RSTRNT DISP (MISCELLANEOUS) ×1
BASEPLATE GLENOID SHLDR SM (Shoulder) ×2 IMPLANT
BLADE SAW SGTL 83.5X18.5 (BLADE) ×3 IMPLANT
BSPLAT GLND SM PRFT SHLDR CA (Shoulder) ×1 IMPLANT
COVER SURGICAL LIGHT HANDLE (MISCELLANEOUS) ×3 IMPLANT
CUP SUT UNIV REVERS 39 +2 (Shoulder) ×2 IMPLANT
DERMABOND ADVANCED (GAUZE/BANDAGES/DRESSINGS) ×2
DERMABOND ADVANCED .7 DNX12 (GAUZE/BANDAGES/DRESSINGS) ×1 IMPLANT
DRAPE ORTHO SPLIT 77X108 STRL (DRAPES) ×6
DRAPE SURG 17X11 SM STRL (DRAPES) ×3 IMPLANT
DRAPE SURG ORHT 6 SPLT 77X108 (DRAPES) ×2 IMPLANT
DRAPE U-SHAPE 47X51 STRL (DRAPES) ×3 IMPLANT
DRSG AQUACEL AG ADV 3.5X10 (GAUZE/BANDAGES/DRESSINGS) ×3 IMPLANT
DURAPREP 26ML APPLICATOR (WOUND CARE) ×3 IMPLANT
ELECT BLADE 4.0 EZ CLEAN MEGAD (MISCELLANEOUS) ×3
ELECT CAUTERY BLADE 6.4 (BLADE) ×3 IMPLANT
ELECT REM PT RETURN 9FT ADLT (ELECTROSURGICAL) ×3
ELECTRODE BLDE 4.0 EZ CLN MEGD (MISCELLANEOUS) ×1 IMPLANT
ELECTRODE REM PT RTRN 9FT ADLT (ELECTROSURGICAL) ×1 IMPLANT
FACESHIELD WRAPAROUND (MASK) ×9 IMPLANT
FACESHIELD WRAPAROUND OR TEAM (MASK) ×3 IMPLANT
GLENOSPHERE LAT 39+4 SHOULDER (Shoulder) ×2 IMPLANT
GLOVE BIO SURGEON STRL SZ7.5 (GLOVE) ×3 IMPLANT
GLOVE BIO SURGEON STRL SZ8 (GLOVE) ×3 IMPLANT
GLOVE BIOGEL PI IND STRL 6.5 (GLOVE) IMPLANT
GLOVE BIOGEL PI IND STRL 7.5 (GLOVE) IMPLANT
GLOVE BIOGEL PI INDICATOR 6.5 (GLOVE) ×2
GLOVE BIOGEL PI INDICATOR 7.5 (GLOVE) ×2
GLOVE EUDERMIC 7 POWDERFREE (GLOVE) ×3 IMPLANT
GLOVE SS BIOGEL STRL SZ 7.5 (GLOVE) ×1 IMPLANT
GLOVE SS N UNI LF 7.0 STRL (GLOVE) ×2 IMPLANT
GLOVE SUPERSENSE BIOGEL SZ 7.5 (GLOVE) ×2
GLOVE SURG SS PI 6.0 STRL IVOR (GLOVE) ×2 IMPLANT
GOWN STRL REUS W/ TWL LRG LVL3 (GOWN DISPOSABLE) IMPLANT
GOWN STRL REUS W/ TWL XL LVL3 (GOWN DISPOSABLE) ×2 IMPLANT
GOWN STRL REUS W/TWL LRG LVL3 (GOWN DISPOSABLE)
GOWN STRL REUS W/TWL XL LVL3 (GOWN DISPOSABLE) ×6
INSERT HUMERAL MED 39/ +3 (Shoulder) IMPLANT
INSERT MEDIUM HUMERAL 39/ +3 (Shoulder) ×2 IMPLANT
KIT BASIN OR (CUSTOM PROCEDURE TRAY) ×3 IMPLANT
KIT ROOM TURNOVER OR (KITS) ×3 IMPLANT
MANIFOLD NEPTUNE II (INSTRUMENTS) ×3 IMPLANT
NDL 1/2 CIR CATGUT .05X1.09 (NEEDLE) ×1 IMPLANT
NEEDLE 1/2 CIR CATGUT .05X1.09 (NEEDLE) ×3 IMPLANT
NS IRRIG 1000ML POUR BTL (IV SOLUTION) ×3 IMPLANT
PACK SHOULDER (CUSTOM PROCEDURE TRAY) ×3 IMPLANT
PAD ARMBOARD 7.5X6 YLW CONV (MISCELLANEOUS) ×6 IMPLANT
RESTRAINT HEAD UNIVERSAL NS (MISCELLANEOUS) ×3 IMPLANT
SCREW CENTRAL NONLOCK 6.5X20MM (Shoulder) ×2 IMPLANT
SCREW LOCK PERIPHERAL 30MM (Shoulder) ×4 IMPLANT
SET PIN UNIVERSAL REVERSE (SET/KITS/TRAYS/PACK) ×2 IMPLANT
SLING ARM FOAM STRAP LRG (SOFTGOODS) ×2 IMPLANT
SPACER REVERSE UNI 39/ +6MM (Shoulder) ×1 IMPLANT
SPACER SHLD UNI REV 39 +6 (Shoulder) ×1 IMPLANT
SPONGE LAP 4X18 X RAY DECT (DISPOSABLE) ×2 IMPLANT
STEM REVERS UNIVERS SZ11 CAP (Shoulder) ×2 IMPLANT
SUCTION FRAZIER HANDLE 10FR (MISCELLANEOUS) ×2
SUCTION TUBE FRAZIER 10FR DISP (MISCELLANEOUS) ×1 IMPLANT
SUT FIBERWIRE #2 38 T-5 BLUE (SUTURE) ×9
SUT MNCRL AB 3-0 PS2 18 (SUTURE) ×3 IMPLANT
SUT MON AB 2-0 CT1 36 (SUTURE) ×3 IMPLANT
SUT VIC AB 1 CT1 27 (SUTURE) ×3
SUT VIC AB 1 CT1 27XBRD ANBCTR (SUTURE) ×1 IMPLANT
SUT VIC AB 2-0 CT1 27 (SUTURE)
SUT VIC AB 2-0 CT1 TAPERPNT 27 (SUTURE) IMPLANT
SUTURE FIBERWR #2 38 T-5 BLUE (SUTURE) ×2 IMPLANT
TOWEL OR 17X24 6PK STRL BLUE (TOWEL DISPOSABLE) ×3 IMPLANT
TOWEL OR 17X26 10 PK STRL BLUE (TOWEL DISPOSABLE) ×3 IMPLANT

## 2016-01-11 NOTE — Transfer of Care (Signed)
Immediate Anesthesia Transfer of Care Note  Patient: Craig Jensen  Procedure(s) Performed: Procedure(s): RIGHT REVERSE SHOULDER ARTHROPLASTY (Right)  Patient Location: PACU  Anesthesia Type:GA combined with regional for post-op pain  Level of Consciousness: sedated  Airway & Oxygen Therapy: Patient Spontanous Breathing and Patient connected to face mask oxygen  Post-op Assessment: Report given to RN, Post -op Vital signs reviewed and stable and Patient moving all extremities  Post vital signs: Reviewed and stable  Last Vitals:  Filed Vitals:   01/11/16 0925 01/11/16 0931  BP:  211/79  Pulse: 70 72  Temp:    Resp:      Last Pain: There were no vitals filed for this visit.       Complications: No apparent anesthesia complications

## 2016-01-11 NOTE — Op Note (Signed)
01/11/2016  11:56 AM  PATIENT:   Craig Jensen  82 y.o. male  PRE-OPERATIVE DIAGNOSIS:  RIGHT SHOULDER ROTATOR CUFF TEAR  ARTHROPATHY  POST-OPERATIVE DIAGNOSIS:  same  PROCEDURE:  R reverse shoulder arthroplasty #11 stem, +6 spacer, +3 poly, 39 glenosphere  SURGEON:  Rai Sinagra, Metta Clines M.D.  ASSISTANTS: Shuford pac   ANESTHESIA:   GET + ISB  EBL: 200   SPECIMEN:  none  Drains: none   PATIENT DISPOSITION:  PACU - hemodynamically stable.    PLAN OF CARE: Admit for overnight observation  Dictation# [id number]   Contact # [phone]

## 2016-01-11 NOTE — Op Note (Signed)
NAME:  AMAY, SCHILTZ NO.:  [id number]  MEDICAL RECORD NO.:  [id number]  LOCATION:  [facility]                         FACILITY:  [facility]  PHYSICIAN:  Metta Clines. Vijay Durflinger, M.D.  DATE OF BIRTH:  Oct 15, 1929  DATE OF PROCEDURE:  01/11/2016 DATE OF DISCHARGE:                              OPERATIVE REPORT   PREOPERATIVE DIAGNOSIS:  End-stage right shoulder rotator cuff tear arthropathy.  POSTOPERATIVE DIAGNOSIS:  End-stage right shoulder rotator cuff tear arthropathy.  PROCEDURE:  Right shoulder reverse arthroplasty utilizing a press-fit size 11 Arthrex stem with a +6 spacer, +3 poly and a 39 glenosphere and a small baseplate.  SURGEON:  Metta Clines. Tarrin Lebow, M.D.  Terrence DupontOlivia Mackie A. Shuford, P.A.-C.  ANESTHESIA:  General endotracheal as well as an interscalene block.  ESTIMATED BLOOD LOSS:  200 mL.  DRAINS:  None.  HISTORY:  Mr. Hansing is an 82 year old gentleman who has had chronic right shoulder pain with severe restriction mobility related to advanced glenohumeral arthritis with rotator cuff arthropathy.  Due to his increasing pain and functional limitations, he is brought to the operating room at this time for planned right shoulder reverse arthroplasty.  Preoperatively, I counseled Mr. Rail regarding treatment options and potential risks versus benefits thereof.  Possible surgical complications were all reviewed including bleeding, infection, neurovascular injury, persistent pain, loss of motion, anesthetic complication, failure of the implant, possible need for additional surgery.  He understands and accepts and agrees with the planned procedure.  PROCEDURE IN DETAIL:  After undergoing routine preop evaluation, the patient received prophylactic antibiotics and an interscalene block was established in the holding area by the Anesthesia Department.  Placed supine on the operating table, underwent smooth induction of a general endotracheal anesthesia.   Placed into a beach-chair position and appropriately padded and protected.  The right shoulder girdle region was sterilely prepped and draped in standard fashion.  Time-out was called.  An anterior deltopectoral approach to the right shoulder was made through a 10-cm incision.  Skin flaps were elevated and dissection carried deeply.  The deltopectoral interval was identified.  Cephalic vein was taken laterally with the deltoid.  Pectoralis was retracted medially.  Upper centimeter of the tendon of the pec major was tenotomized to enhance exposure.  We then divided adhesions beneath the deltoid, gained exposure of the conjoined tendon, which was mobilized and retracted medially.  We then unroofed and tenotomized the long head biceps tendon for later tenodesis.  The remnant of the subscapularis was then carefully divided away from the lesser tuberosity, and at this point, we found a massive osteophyte emanating off the inferior aspect of the humeral head, also several loose bodies anteriorly in the joint, which we removed.  Once I divided the capsular tissues from the anteroinferior and inferior aspects of the humeral head and then exposed the osteophyte, we used an osteotome to excise this large inferior osteophyte, which was approximately 3 x 4 cm.  This then allowed [place] to further expose the proximal humerus, which was markedly deformed and the articular surface had been completely flattened.  At this point, we used the extramedullary guide to then perform our humeral head resection at approximately 20 degrees of retroversion using  our extramedullary guide. Then, a rongeur was used to remove residual additional osteophytes on the margin of the humeral head anteriorly and inferiorly.  Metal cap was then placed over the cut surface of the proximal humerus.  At this point, we then gained exposure of the glenoid and this was challenging due to the marked long-term deformity and contractures  and performed a circumferential labral resection of capsular release, mobilized the subscapularis and again tagged the free margin with #2 FiberWires. There were number of large osteophytes and loose bodies in the posterior aspect of the joint, a large osteophyte emanating off the superior aspect of the glenoid, these were all carefully removed with soft tissue dissection.  Once the osteophytes were removed from their entirety, we were then able to gain circumferential exposure of the glenoid.  I then placed a guidepin into the center of the glenoid, in such way that we were able to correct the minor degree of retroversion that we found.  We then performed reaming of the glenoid, allowing [place] to gain proper version and orientation for our small baseplate.  I also utilized the medium and large peripheral reamers to remove bone from the margins of the glenoid inferiorly and superiorly and gaining complete exposure and proper preparation.  Once we completed the preparation of the glenoid, we impacted our glenoid baseplate.  We placed our central lag screw and then the superior and inferior locking screws, which had excellent fit and fixation.  We then placed our 39 glenosphere onto the baseplate. This was impacted, we confirmed solid fixation.  At this point, we then returned our attention to the proximal humerus and we prepared the canal, hand reaming up to size 7 and we broached all the way up to size 11 with excellent fit and fixation and maintaining approximately 20 degrees of retroversion.  We then reamed the metaphysis with the appropriate 39 reamer and then performed a trial reduction, which showed good soft tissue balance.  Our trial stem was then removed, the final implant was assembled with the 135-degree neck angle.  The final implant was then inserted into the humeral shaft, which had excellent fit and fixation.  We then performed a series of trial reductions,  ultimately determining that total of 9 mm would be the appropriate soft tissue balance.  We then applied a +6 metal spacer and +3 poly and our final reduction showed excellent soft tissue balance, excellent stability of the implant, good soft tissue balance and motion and tension.  The joint was copiously irrigated, we confirmed that all bony debris was properly removed.  We performed a tenodesis of the long head biceps tendon at the level of the upper pec major.  We repaired the subscapularis back to the lesser tuberosity using the #2 FiberWires, which we passed through drill holes and bone tunnel and in the lesser tuberosity region and this allowed [place] to easily bring arm out to 20 degrees of external rotation without excessive tension on the subscapularis repair.  I should mention that I did mobilize the subscapularis prior to repair.  At this point, the wound was then copiously irrigated.  Hemostasis was obtained.  The deltopectoral interval was then closed with a series of figure-of-eight #1 Vicryl sutures.  2-0 Vicryl was used for the subcu layer, intracuticular 3-0 Monocryl for the skin followed by Dermabond and an Aquacel dressing.  Right arm was placed in a sling.  The patient was awakened, extubated, and taken to the recovery room in stable condition.  Jenetta Loges, PA-C was used as an Environmental consultant throughout this case, essential for help with positioning the patient, positioning the extremity, management of the tissue retractors, tissue manipulation, implantation of prosthesis, wound closure, and intraoperative decision making.    Metta Clines. Vishruth Seoane, M.D.    KMS/MEDQ  D:  01/11/2016  T:  01/11/2016  Job:  [id number]

## 2016-01-11 NOTE — Anesthesia Procedure Notes (Addendum)
Anesthesia Regional Block:  Interscalene brachial plexus block  Pre-Anesthetic Checklist: ,, timeout performed, Correct Patient, Correct Site, Correct Laterality, Correct Procedure, Correct Position, site marked, Risks and benefits discussed,  Surgical consent,  Pre-op evaluation,  At surgeon's request and post-op pain management  Laterality: Right  Prep: chloraprep       Needles:  Injection technique: Single-shot  Needle Type: Echogenic Stimulator Needle     Needle Length: 5cm 5 cm Needle Gauge: 22 and 22 G    Additional Needles:  Procedures: ultrasound guided (picture in chart) Interscalene brachial plexus block Narrative:  Injection made incrementally with aspirations every 5 mL.  Performed by: Personally  Anesthesiologist: Catalina Gravel  Additional Notes: Functioning IV was confirmed and monitors were applied.  A 71mm 22ga echogenic stimulator needle was used. Sterile prep and drape, hand hygiene and sterile gloves were used.  Negative aspiration and negative test dose prior to incremental administration of local anesthetic. The patient tolerated the procedure well.  Ultrasound guidance: relevent anatomy identified, needle position confirmed, local anesthetic spread visualized around nerve(s), vascular puncture avoided.  Image printed for medical record.    Procedure Name: Intubation Date/Time: 01/11/2016 10:01 AM Performed by: Rush Farmer E Pre-anesthesia Checklist: Patient identified, Emergency Drugs available, Suction available, Patient being monitored and Timeout performed Patient Re-evaluated:Patient Re-evaluated prior to inductionOxygen Delivery Method: Circle system utilized Preoxygenation: Pre-oxygenation with 100% oxygen Intubation Type: IV induction Ventilation: Mask ventilation without difficulty Laryngoscope Size: Mac and 4 Grade View: Grade I Tube type: Oral Tube size: 7.5 mm Number of attempts: 1 Airway Equipment and Method: Stylet Placement  Confirmation: ETT inserted through vocal cords under direct vision,  positive ETCO2 and breath sounds checked- equal and bilateral Secured at: 22 cm Tube secured with: Tape Dental Injury: Teeth and Oropharynx as per pre-operative assessment

## 2016-01-11 NOTE — Anesthesia Postprocedure Evaluation (Signed)
Anesthesia Post Note  Patient: Craig Jensen  Procedure(s) Performed: Procedure(s) (LRB): RIGHT REVERSE SHOULDER ARTHROPLASTY (Right)  Patient location during evaluation: PACU Anesthesia Type: General and Regional Level of consciousness: awake and alert Pain management: pain level controlled Vital Signs Assessment: post-procedure vital signs reviewed and stable Respiratory status: spontaneous breathing, nonlabored ventilation, respiratory function stable and patient connected to nasal cannula oxygen Cardiovascular status: blood pressure returned to baseline and stable Postop Assessment: no signs of nausea or vomiting Anesthetic complications: no    Last Vitals:  Filed Vitals:   01/11/16 1301 01/11/16 1349  BP: 149/63 157/55  Pulse: 52 55  Temp:  36.4 C  Resp: 15 18    Last Pain:  Filed Vitals:   01/11/16 1350  PainSc: 0-No pain                 Catalina Gravel

## 2016-01-11 NOTE — Progress Notes (Signed)
Utilization review completed.  

## 2016-01-11 NOTE — H&P (Signed)
Craig Jensen    Chief Complaint: RIGHT SHOULDER ROTATOR CUFF TEAR  ARTHROPATHY HPI: The patient is a 82 y.o. male with end stage right shoulder rotator cuff tear arthropathy  Past Medical History  Diagnosis Date  . Cataract   . Arthritis   . Peripheral neuropathy (Craig Jensen)   . Hyperlipidemia   . Diabetes mellitus without complication (Craig Jensen)   . Inguinal hernia   . Kidney stones     Past Surgical History  Procedure Laterality Date  . Vasectomy    . Kidney stone surgery    . Eye surgery Bilateral     cataract surgery w/ lens implant  . Hernia repair Left     inguinal hernia  . Inner ear surgery Right   . Colonoscopy      Family History  Problem Relation Age of Onset  . Cancer Mother   . Stroke Mother   . Heart disease Father   . Stroke Brother     brain aneurysm  . Heart disease Brother     Social History:  reports that he has never smoked. He has never used smokeless tobacco. He reports that he drinks alcohol. He reports that he does not use illicit drugs.   No prescriptions prior to admission     Physical Exam: right shoulder with severely restricted and painful motion as noted at recent office visits  Vitals  Temp:  [98.3 F (36.8 C)] 98.3 F (36.8 C) (05/04 0836) Pulse Rate:  [63] 63 (05/04 0836) Resp:  [20] 20 (05/04 0836) BP: (201)/(65) 201/65 mmHg (05/04 0836) SpO2:  [97 %] 97 % (05/04 0836)  Assessment/Plan  Impression: RIGHT SHOULDER ROTATOR CUFF TEAR  ARTHROPATHY  Plan of Action: Procedure(s): RIGHT REVERSE SHOULDER ARTHROPLASTY  [name] 01/11/2016, 9:34 AM Contact # [phone]

## 2016-01-11 NOTE — Anesthesia Preprocedure Evaluation (Addendum)
Anesthesia Evaluation  Patient identified by MRN, date of birth, ID band Patient awake    Reviewed: Allergy & Precautions, NPO status , Patient's Chart, lab work & pertinent test results  Airway Mallampati: II  TM Distance: >3 FB Neck ROM: Full    Dental  (+) Teeth Intact, Dental Advisory Given   Pulmonary neg pulmonary ROS,    Pulmonary exam normal breath sounds clear to auscultation       Cardiovascular negative cardio ROS Normal cardiovascular exam Rhythm:Regular Rate:Normal     Neuro/Psych  Neuromuscular disease negative psych ROS   GI/Hepatic negative GI ROS, Neg liver ROS,   Endo/Other  diabetes, Type 2  Renal/GU negative Renal ROS     Musculoskeletal  (+) Arthritis , Osteoarthritis,    Abdominal   Peds  Hematology negative hematology ROS (+)   Anesthesia Other Findings Day of surgery medications reviewed with the patient.  Reproductive/Obstetrics                            Anesthesia Physical Anesthesia Plan  ASA: II  Anesthesia Plan: General   Post-op Pain Management: GA combined w/ Regional for post-op pain   Induction: Intravenous  Airway Management Planned: Oral ETT  Additional Equipment:   Intra-op Plan:   Post-operative Plan: Extubation in OR  Informed Consent: I have reviewed the patients History and Physical, chart, labs and discussed the procedure including the risks, benefits and alternatives for the proposed anesthesia with the patient or authorized representative who has indicated his/her understanding and acceptance.   Dental advisory given  Plan Discussed with: CRNA  Anesthesia Plan Comments: (Risks/benefits of general anesthesia discussed with patient including risk of damage to teeth, lips, gum, and tongue, nausea/vomiting, allergic reactions to medications, and the possibility of heart attack, stroke and death.  All patient questions answered.  Patient  wishes to proceed.  Discussed risks and benefits of interscalene block including failure, bleeding, infection, nerve damage, weakness, shortness of breath, pneumothorax. Questions answered. Patient consents to block. )        Anesthesia Quick Evaluation

## 2016-01-12 ENCOUNTER — Encounter (HOSPITAL_COMMUNITY): Payer: Self-pay | Admitting: Orthopedic Surgery

## 2016-01-12 LAB — GLUCOSE, CAPILLARY
Glucose-Capillary: 178 mg/dL — ABNORMAL HIGH (ref 65–99)
Glucose-Capillary: 185 mg/dL — ABNORMAL HIGH (ref 65–99)

## 2016-01-12 MED ORDER — HYDROCODONE-ACETAMINOPHEN 5-325 MG PO TABS: 1.0000 | ORAL_TABLET | ORAL | Status: DC | PRN

## 2016-01-12 MED ORDER — DIAZEPAM 2 MG PO TABS: 2.0000 mg | ORAL_TABLET | Freq: Four times a day (QID) | ORAL | Status: DC | PRN

## 2016-01-12 MED ORDER — ONDANSETRON HCL 4 MG PO TABS: 4.0000 mg | ORAL_TABLET | Freq: Three times a day (TID) | ORAL | Status: DC | PRN

## 2016-01-12 NOTE — Progress Notes (Signed)
Occupational Therapy Treatment Patient Details Name: Craig Jensen MRN: [id number] DOB: 1930-05-06 Today's Date: 01/12/2016    History of present illness 82 y.o. male with end stage right shoulder rotator cuff tear arthropathy now s/p reverse total shoulder arthroplasty. PMH: diabetes, neuropathy   OT comments  Pt seen for second session to train wife to do exercises with R shoulder.  Wife demonstrated independence in assisting with exercises.  Reviewed adl techniques as well.  Pt ready for d/c from OT standpoint.  Follow Up Recommendations  Home health OT;Supervision - Intermittent    Equipment Recommendations  None recommended by OT    Recommendations for Other Services      Precautions / Restrictions Precautions Precautions: Shoulder Type of Shoulder Precautions: FF to 90, abduction to 60, no internal rotation ex. Shoulder Interventions: Shoulder sling/immobilizer;Off for dressing/bathing/exercises Required Braces or Orthoses: Sling Restrictions Weight Bearing Restrictions: Yes RUE Weight Bearing: Non weight bearing       Mobility Bed Mobility Overal bed mobility: Modified Independent             General bed mobility comments: Pt up in chair upon arrival. Reports that he was able to get out of bed on his own.   Transfers Overall transfer level: Needs assistance Equipment used: None Transfers: Sit to/from Stand Sit to Stand: Supervision         General transfer comment: supervision for safety    Balance Overall balance assessment: No apparent balance deficits (not formally assessed)                                 ADL Overall ADL's : Needs assistance/impaired Eating/Feeding: Set up;Sitting   Grooming: Set up;Sitting   Upper Body Bathing: Minimal assitance;Sitting   Lower Body Bathing: Set up;Sit to/from stand   Upper Body Dressing : Moderate assistance;Sitting Upper Body Dressing Details (indicate cue type and reason): educated re:  techniques for UE dressing Lower Body Dressing: Minimal assistance;Sit to/from stand Lower Body Dressing Details (indicate cue type and reason): min assist to fasten pants. Toilet Transfer: Supervision/safety;Ambulation;Comfort height toilet   Toileting- Clothing Manipulation and Hygiene: Supervision/safety;Sit to/from stand   Tub/ Shower Transfer: Walk-in shower;Supervision/safety;Ambulation   Functional mobility during ADLs: Supervision/safety General ADL Comments: Pt seen for second session to train wife how to do shoulder exercises and how to assist with adls. Wife demonstrated indepence assisting pt with all exercises.      Vision                     Perception     Praxis      Cognition   Behavior During Therapy: WFL for tasks assessed/performed Overall Cognitive Status: Within Functional Limits for tasks assessed                       Extremity/Trunk Assessment  Upper Extremity Assessment Upper Extremity Assessment: RUE deficits/detail RUE Deficits / Details: AAROM FF to 30 degrees, abduction to 20 degrees, elbow, wrist and hand WFL. RUE: Unable to fully assess due to immobilization;Unable to fully assess due to pain RUE Coordination: decreased gross motor   Lower Extremity Assessment Lower Extremity Assessment: Overall WFL for tasks assessed   Cervical / Trunk Assessment Cervical / Trunk Assessment: Normal    Exercises Shoulder Exercises Shoulder Flexion: AAROM;10 reps;Seated;Right Shoulder ABduction: AAROM;Right;10 reps;Seated Elbow Flexion: AROM;Right;10 reps;Seated Elbow Extension: AROM;Right;10 reps;Seated Wrist Flexion: AROM;Right;10 reps;Seated Wrist Extension: AROM;Right;10  reps;Seated Digit Composite Flexion: AROM;Right;10 reps;Seated Composite Extension: AROM;Right;10 reps;Seated Donning/doffing shirt without moving shoulder: Moderate assistance Method for sponge bathing under operated UE: Independent Donning/doffing  sling/immobilizer: Minimal assistance;Caregiver independent with task Correct positioning of sling/immobilizer: Independent ROM for elbow, wrist and digits of operated UE: Independent Sling wearing schedule (on at all times/off for ADL's): Independent Proper positioning of operated UE when showering: Independent Positioning of UE while sleeping: Independent   Shoulder Instructions Shoulder Instructions Donning/doffing shirt without moving shoulder: Moderate assistance Method for sponge bathing under operated UE: Independent Donning/doffing sling/immobilizer: Minimal assistance;Caregiver independent with task Correct positioning of sling/immobilizer: Independent ROM for elbow, wrist and digits of operated UE: Independent Sling wearing schedule (on at all times/off for ADL's): Independent Proper positioning of operated UE when showering: Independent Positioning of UE while sleeping: Independent     General Comments      Pertinent Vitals/ Pain       Pain Assessment: 0-10 Pain Score: 5  Pain Location: rt shoulder Pain Descriptors / Indicators: Aching Pain Intervention(s): Limited activity within patient's tolerance;Monitored during session;Premedicated before session;Repositioned;Relaxation  Home Living Family/patient expects to be discharged to:: Private residence Living Arrangements: Spouse/significant other Available Help at Discharge: Family;Available 24 hours/day Type of Home: Apartment Home Access: Level entry     Home Layout: One level     Bathroom Shower/Tub: Walk-in shower;Door   [employer] Toilet: Handicapped height     Home Equipment: Shower seat - built in   Additional Comments: Pt lives at [facility].  Meals available if needed.       Prior Functioning/Environment Level of Independence: Independent            Frequency       Progress Toward Goals  OT Goals(current goals can now be found in the care plan section)  Progress towards OT goals: Goals  met/education completed, patient discharged from OT  Acute Rehab OT Goals Patient Stated Goal: head back home. OT Goal Formulation: All assessment and education complete, DC therapy  Plan Discharge plan remains appropriate    Co-evaluation                 End of Session Equipment Utilized During Treatment: Other (comment) (sling to R arm)   Activity Tolerance Patient tolerated treatment well   Patient Left in chair;with call bell/phone within reach   Nurse Communication Mobility status        Time: 1240-1250 OT Time Calculation (min): 10 min  Charges: OT General Charges $OT Visit: 1 Procedure OT Evaluation $OT Eval Moderate Complexity: 1 Procedure OT Treatments $Self Care/Home Management : 8-22 mins $Therapeutic Exercise: 8-22 mins  Glenford Peers 01/12/2016, 12:54 PM  [phone]

## 2016-01-12 NOTE — Care Management Note (Signed)
Case Management Note  Patient Details  Name: Craig Jensen MRN: [id number] Date of Birth: 1930-07-29  Subjective/Objective:  82 yr old gentleman s/p right shoulder rotator cuff arthroplasty.                  Action/Plan: Case manager spoke with patient concerning home health needs. Choice was offered. Referral was called to Christa See, [facility] liaison. Patient wanted CM to be sure that they will accept his UHC/Medicare Plan. CM askerd [facility] to confirm that he will be covered for therapy. Received call back stating he will be covered. CM gave this information to patient and he is agreeable to continue with arrangement for home health. Patient will have family assistance at discharge.    Expected Discharge Date:  01/12/16               Expected Discharge Plan:  [place]  In-House Referral:     Discharge planning Services  CM Consult  Post Acute Care Choice:  Home Health Choice offered to:  Patient  DME Arranged:  N/A DME Agency:  NA  HH Arranged:  OT, PT HH Agency:  [place]  Status of Service:  Completed, signed off  Medicare Important Message Given:    Date Medicare IM Given:    Medicare IM give by:    Date Additional Medicare IM Given:    Additional Medicare Important Message give by:     If discussed at [place] of Stay Meetings, dates discussed:    Additional Comments:  Ninfa Meeker, RN 01/12/2016, 12:12 PM

## 2016-01-12 NOTE — Discharge Instructions (Signed)

## 2016-01-12 NOTE — Evaluation (Signed)
Occupational Therapy Evaluation and Discharge Summary Patient Details Name: Craig Jensen MRN: [id number] DOB: 1930/08/25 Today's Date: 01/12/2016    History of Present Illness 82 y.o. male with end stage right shoulder rotator cuff tear arthropathy now s/p reverse total shoulder arthroplasty. PMH: diabetes, neuropathy   Clinical Impression   Pt admitted for above diagnosis and overall is doing very well with exercises for shoulder, wrist and hand and has working knowledge of adl techniques since he has had so much pain in this shoulder for so long. Pt is independent doffing sling and requires min assist to donn.  Pt understands all exercises.  Wife will be at home to assist with adls and exercises as needed.  Advance therapy as MD orders.     Follow Up Recommendations  Home health OT;Supervision - Intermittent    Equipment Recommendations  None recommended by OT    Recommendations for Other Services       Precautions / Restrictions Precautions Precautions: Shoulder Type of Shoulder Precautions: FF to 90, abduction to 60, no internal rotation ex. Shoulder Interventions: Shoulder sling/immobilizer;Off for dressing/bathing/exercises Required Braces or Orthoses: Sling Restrictions Weight Bearing Restrictions: Yes RUE Weight Bearing: Non weight bearing      Mobility Bed Mobility Overal bed mobility: Modified Independent             General bed mobility comments: Pt up in chair upon arrival. Reports that he was able to get out of bed on his own.   Transfers Overall transfer level: Needs assistance Equipment used: None Transfers: Sit to/from Stand Sit to Stand: Supervision         General transfer comment: supervision for safety    Balance Overall balance assessment: No apparent balance deficits (not formally assessed)                                          ADL Overall ADL's : Needs assistance/impaired Eating/Feeding: Set up;Sitting    Grooming: Set up;Sitting   Upper Body Bathing: Minimal assitance;Sitting   Lower Body Bathing: Set up;Sit to/from stand   Upper Body Dressing : Moderate assistance;Sitting Upper Body Dressing Details (indicate cue type and reason): educated re: techniques for UE dressing Lower Body Dressing: Minimal assistance;Sit to/from stand Lower Body Dressing Details (indicate cue type and reason): min assist to fasten pants. Toilet Transfer: Supervision/safety;Ambulation;Comfort height toilet   Toileting- Clothing Manipulation and Hygiene: Supervision/safety;Sit to/from stand   Tub/ Shower Transfer: Walk-in shower;Supervision/safety;Ambulation   Functional mobility during ADLs: Supervision/safety General ADL Comments: Pt doing well with adls. Limited most with UE dressing. pt did donn and doff sling with min assist to donn only.     Vision     Perception     Praxis      Pertinent Vitals/Pain Pain Assessment: 0-10 Pain Score: 3  Pain Location: Rt shoulder Pain Descriptors / Indicators: Aching Pain Intervention(s): Limited activity within patient's tolerance;Monitored during session;Premedicated before session;Repositioned;Relaxation     Hand Dominance Right   Extremity/Trunk Assessment Upper Extremity Assessment Upper Extremity Assessment: RUE deficits/detail RUE Deficits / Details: AAROM FF to 30 degrees, abduction to 20 degrees, elbow, wrist and hand WFL. RUE: Unable to fully assess due to immobilization;Unable to fully assess due to pain RUE Coordination: decreased gross motor   Lower Extremity Assessment Lower Extremity Assessment: Overall WFL for tasks assessed   Cervical / Trunk Assessment Cervical / Trunk Assessment: Normal  Communication Communication Communication: No difficulties   Cognition Arousal/Alertness: Awake/alert Behavior During Therapy: WFL for tasks assessed/performed Overall Cognitive Status: Within Functional Limits for tasks assessed                      General Comments       Exercises Exercises: Shoulder     Shoulder Instructions Shoulder Instructions Donning/doffing shirt without moving shoulder: Moderate assistance Method for sponge bathing under operated UE: Independent Donning/doffing sling/immobilizer: Minimal assistance;Caregiver independent with task Correct positioning of sling/immobilizer: Independent ROM for elbow, wrist and digits of operated UE: Independent Sling wearing schedule (on at all times/off for ADL's): Independent Proper positioning of operated UE when showering: Independent Positioning of UE while sleeping: [name] expects to be discharged to:: Private residence Living Arrangements: Spouse/significant other Available Help at Discharge: Family;Available 24 hours/day Type of Home: Apartment Home Access: Level entry     Home Layout: One level     Bathroom Shower/Tub: Walk-in shower;Door   [employer] Toilet: Handicapped height     Home Equipment: Shower seat - built in   Additional Comments: Pt lives at [facility].  Meals available if needed.       Prior Functioning/Environment Level of Independence: Independent             OT Diagnosis: Generalized weakness   OT Problem List: Decreased strength;Decreased range of motion;Pain;Impaired UE functional use   OT Treatment/Interventions:      OT Goals(Current goals can be found in the care plan section) Acute Rehab OT Goals Patient Stated Goal: head back home. OT Goal Formulation: All assessment and education complete, DC therapy  OT Frequency:     Barriers to D/C:            Co-evaluation              End of Session Equipment Utilized During Treatment: Other (comment) (sling to R arm) Nurse Communication: Mobility status  Activity Tolerance: Patient tolerated treatment well Patient left: in chair;with call bell/phone within reach   Time: 0928-0956 OT Time Calculation (min):  28 min Charges:  OT General Charges $OT Visit: 1 Procedure OT Evaluation $OT Eval Moderate Complexity: 1 Procedure OT Treatments $Self Care/Home Management : 8-22 mins G-Codes:    Glenford Peers 01-25-2016, 10:11 AM  [phone]

## 2016-01-12 NOTE — Evaluation (Signed)
Physical Therapy Evaluation Patient Details Name: Craig Jensen MRN: [id number] DOB: 09/02/1930 Today's Date: 01/12/2016   History of Present Illness  82 y.o. male with end stage right shoulder rotator cuff tear arthropathy now s/p reverse total shoulder arthroplasty. PMH: diabetes, neuropathy  Clinical Impression  Pt doing well with initial mobility during PT session. Pt able to ambulate without an assistive device and supervision for safety. PT to follow to assist with mobility progression for D/C to home. Pt denied any questions or concerns.     Follow Up Recommendations No PT follow up;Supervision for mobility/OOB    Equipment Recommendations  None recommended by PT    Recommendations for Other Services       Precautions / Restrictions Precautions Precautions: Shoulder Shoulder Interventions: Shoulder sling/immobilizer;Off for dressing/bathing/exercises Required Braces or Orthoses: Sling Restrictions Weight Bearing Restrictions: Yes RUE Weight Bearing: Non weight bearing      Mobility  Bed Mobility               General bed mobility comments: Pt up in chair upon arrival. Reports that he was able to get out of bed on his own.   Transfers Overall transfer level: Needs assistance Equipment used: None Transfers: Sit to/from Stand Sit to Stand: Supervision         General transfer comment: supervision for safety  Ambulation/Gait Ambulation/Gait assistance: Supervision Ambulation Distance (Feet): 400 Feet Assistive device: None Gait Pattern/deviations: Step-through pattern;Decreased step length - right;Decreased step length - left;Wide base of support Gait velocity: decreased   General Gait Details: no loss of balance. Pt reports feeling stable  Stairs            Wheelchair Mobility    Modified Rankin (Stroke Patients Only)       Balance Overall balance assessment: No apparent balance deficits (not formally assessed)                                            Pertinent Vitals/Pain Pain Assessment: 0-10 Pain Score: 2  Pain Location: Rt shoulder Pain Descriptors / Indicators: Aching Pain Intervention(s): Limited activity within patient's tolerance;Monitored during session;Repositioned    Home Living Family/patient expects to be discharged to:: Private residence Living Arrangements: Spouse/significant other Available Help at Discharge: Family;Available 24 hours/day Type of Home: Apartment Home Access: Level entry     Home Layout: One level Home Equipment: None Additional Comments: Pt reports living at an independent living facility with additional help available as needed.     Prior Function Level of Independence: Independent               Hand Dominance        Extremity/Trunk Assessment   Upper Extremity Assessment: Defer to OT evaluation           Lower Extremity Assessment: Overall WFL for tasks assessed         Communication   Communication: No difficulties  Cognition Arousal/Alertness: Awake/alert Behavior During Therapy: WFL for tasks assessed/performed Overall Cognitive Status: Within Functional Limits for tasks assessed                      General Comments      Exercises        Assessment/Plan    PT Assessment Patient needs continued PT services  PT Diagnosis Difficulty walking   PT Problem List Decreased strength;Decreased activity tolerance;Decreased  mobility  PT Treatment Interventions Gait training;Functional mobility training;Therapeutic activities;Therapeutic exercise;Patient/family education   PT Goals (Current goals can be found in the Care Plan section) Acute Rehab PT Goals Patient Stated Goal: head back home. PT Goal Formulation: With patient Time For Goal Achievement: 01/26/16 Potential to Achieve Goals: Good    Frequency Min 3X/week   Barriers to discharge        Co-evaluation               End of Session Equipment  Utilized During Treatment: Gait belt Activity Tolerance: Patient tolerated treatment well Patient left: in chair;with call bell/phone within reach Nurse Communication: Mobility status    Functional Assessment Tool Used: clinical judgment Functional Limitation: Mobility: Walking and moving around Mobility: Walking and Moving Around Current Status [id number]): At least 1 percent but less than 20 percent impaired, limited or restricted Mobility: Walking and Moving Around Goal Status [phone]): At least 1 percent but less than 20 percent impaired, limited or restricted    Time: 0856-0910 PT Time Calculation (min) (ACUTE ONLY): 14 min   Charges:   PT Evaluation $PT Eval Low Complexity: 1 Procedure     PT G Codes:   PT G-Codes **NOT FOR INPATIENT CLASS** Functional Assessment Tool Used: clinical judgment Functional Limitation: Mobility: Walking and moving around Mobility: Walking and Moving Around Current Status [id number]): At least 1 percent but less than 20 percent impaired, limited or restricted Mobility: Walking and Moving Around Goal Status [phone]): At least 1 percent but less than 20 percent impaired, limited or restricted    Cassell Clement, PT, [place] Pager [phone] Office [phone]  01/12/2016, 9:52 AM

## 2016-01-12 NOTE — Discharge Summary (Signed)
PATIENT ID:      Craig Jensen  MRN:     [id number] DOB/AGE:    05/11/1930 / 82 y.o.     DISCHARGE SUMMARY  ADMISSION DATE:    01/11/2016 DISCHARGE DATE:    ADMISSION DIAGNOSIS: RIGHT SHOULDER ROTATOR CUFF TEAR  ARTHROPATHY Past Medical History  Diagnosis Date  . Cataract   . Arthritis   . Peripheral neuropathy ([place])   . Hyperlipidemia   . Diabetes mellitus without complication ([place])   . Inguinal hernia   . Kidney stones     DISCHARGE DIAGNOSIS:   Active Problems:   S/p reverse total shoulder arthroplasty   PROCEDURE: Procedure(s): RIGHT REVERSE SHOULDER ARTHROPLASTY on 01/11/2016  CONSULTS:     HISTORY:  See H&P in chart.  HOSPITAL COURSE:  [name] is a 82 y.o. admitted on 01/11/2016 with a diagnosis of RIGHT SHOULDER ROTATOR CUFF TEAR  ARTHROPATHY.  They were brought to the operating room on 01/11/2016 and underwent Procedure(s): RIGHT REVERSE SHOULDER ARTHROPLASTY.    They were given perioperative antibiotics: Anti-infectives    Start     Dose/Rate Route Frequency Ordered Stop   01/11/16 1600  ceFAZolin (ANCEF) IVPB 2g/100 mL premix     2 g 200 mL/hr over 30 Minutes Intravenous Every 6 hours 01/11/16 1358 01/12/16 0427   01/11/16 0830  ceFAZolin (ANCEF) IVPB 2g/100 mL premix     2 g 200 mL/hr over 30 Minutes Intravenous To Surgery 01/11/16 0826 01/11/16 1015    .  Patient underwent the above named procedure and tolerated it well. The following day they were hemodynamically stable and pain was controlled on oral analgesics. They were neurovascularly intact to the operative extremity. OT was ordered and worked with patient per protocol. They were medically and orthopaedically stable for discharge on day 1. Home health services were arranged    DIAGNOSTIC STUDIES:  RECENT RADIOGRAPHIC STUDIES :  No results found.  RECENT VITAL SIGNS:  Patient Vitals for the past 24 hrs:  BP Temp Temp src Pulse Resp SpO2 Height Weight  01/12/16 0600 - 99.5 F (37.5 C)  Oral - - - - -  01/12/16 0415 (!) 155/59 mmHg (!) 100.9 F (38.3 C) Oral 79 18 91 % - -  01/11/16 2038 (!) 153/74 mmHg 98.5 F (36.9 C) Oral 92 18 92 % - -  01/11/16 1734 - - - - - - 6\' 1"  (1.854 m) 99.791 kg (220 lb)  01/11/16 1349 (!) 157/55 mmHg 97.5 F (36.4 C) - (!) 55 18 99 % - -  01/11/16 1330 - - - - - 94 % - -  01/11/16 1301 (!) 149/63 mmHg - - (!) 52 15 98 % - -  01/11/16 1300 - - - (!) 51 15 97 % - -  01/11/16 1246 (!) 152/66 mmHg - - (!) 58 15 98 % - -  01/11/16 1245 (!) 152/66 mmHg - - (!) 54 14 100 % - -  01/11/16 1231 (!) 145/65 mmHg - - (!) 59 11 96 % - -  01/11/16 1230 - - - (!) 58 11 95 % - -  01/11/16 1215 (!) 131/55 mmHg 97.5 F (36.4 C) - 66 16 97 % - -  01/11/16 0931 (!) 211/79 mmHg - - 72 - 99 % - -  01/11/16 0925 - - - 70 - 98 % - -  01/11/16 0836 (!) 201/65 mmHg 98.3 F (36.8 C) Oral 63 20 97 % - -  .  RECENT  EKG RESULTS:    Orders placed or performed in visit on 01/05/16  . EKG 12-Lead    DISCHARGE INSTRUCTIONS:  Discharge Instructions    Discontinue IV    Complete by:  As directed            DISCHARGE MEDICATIONS:     Medication List    TAKE these medications        diazepam 2 MG tablet  Commonly known as:  VALIUM  Take 1 tablet (2 mg total) by mouth every 6 (six) hours as needed for muscle spasms or sedation.     HYDROcodone-acetaminophen 5-325 MG tablet  Commonly known as:  NORCO  Take 1-2 tablets by mouth every 4 (four) hours as needed.     ondansetron 4 MG tablet  Commonly known as:  ZOFRAN  Take 1 tablet (4 mg total) by mouth every 8 (eight) hours as needed for nausea or vomiting.        FOLLOW UP VISIT:       Follow-up Information    Follow up with Metta Clines SUPPLE, MD.   Specialty:  Orthopedic Surgery   Why:  call to be seen in 10-14 days   Contact information:   [address] [place] [address] [id number]       DISCHARGE TO: Home  DISPOSITION: Good  DISCHARGE CONDITION:   Festus Barren for Dr. Justice Britain 01/12/2016, 8:08 AM

## 2016-01-12 NOTE — Progress Notes (Signed)
Pt being discharged home via wheelchair with family. Pt alert and oriented x4. VSS. Pt c/o no pain at this time. No signs of respiratory distress. Education complete and care plans resolved. IV removed with catheter intact and pt tolerated well. No further issues at this time. Pt to follow up with PCP. Arbutus Nelligan R, RN 

## 2016-01-23 ENCOUNTER — Encounter (HOSPITAL_COMMUNITY): Payer: Self-pay | Admitting: Orthopedic Surgery

## 2016-06-10 ENCOUNTER — Other Ambulatory Visit: Payer: Self-pay | Admitting: Ophthalmology

## 2016-07-23 ENCOUNTER — Other Ambulatory Visit (HOSPITAL_COMMUNITY): Payer: Self-pay | Admitting: Urology

## 2016-07-23 DIAGNOSIS — C61 Malignant neoplasm of prostate: Secondary | ICD-10-CM

## 2016-08-07 ENCOUNTER — Ambulatory Visit (HOSPITAL_COMMUNITY)
Admission: RE | Admit: 2016-08-07 | Discharge: 2016-08-07 | Disposition: A | Discharge status: Home / Self Care | Payer: Medicare Other | Source: Ambulatory Visit | Attending: Urology | Admitting: Urology

## 2016-08-07 ENCOUNTER — Encounter (HOSPITAL_COMMUNITY)
Admission: RE | Admit: 2016-08-07 | Discharge: 2016-08-07 | Disposition: A | Discharge status: Home / Self Care | Payer: Medicare Other | Source: Ambulatory Visit | Attending: Urology | Admitting: Urology

## 2016-08-07 DIAGNOSIS — R948 Abnormal results of function studies of other organs and systems: Secondary | ICD-10-CM | POA: Insufficient documentation

## 2016-08-07 DIAGNOSIS — C61 Malignant neoplasm of prostate: Secondary | ICD-10-CM | POA: Insufficient documentation

## 2016-08-07 MED ORDER — TECHNETIUM TC 99M MEDRONATE IV KIT
25.0000 | PACK | Freq: Once | INTRAVENOUS | Status: AC | PRN
  Administered 2016-08-07: 21 via INTRAVENOUS

## 2016-08-13 ENCOUNTER — Encounter: Payer: Self-pay | Admitting: Radiation Oncology

## 2016-08-13 NOTE — Progress Notes (Addendum)
GU Location of Tumor / Histology:  Prostatic adenocarcinoma  If Prostate Cancer, Gleason Score is (4 + 4) and PSA is (22.20) on 05/2016. Reports his PSA May 2017 was 12. Approximate prostate volume 70 grams.  [place] was referred to Dr. Risa Grill by Dr. Reynaldo Minium for evaluation of an elevated PSA.  Biopsies of prostate (if applicable) revealed:    Past/Anticipated interventions by urology, if any: biopsy, Lupron (4 month), Casodex. Received Lupron and began taking Casodex 08/09/16.  Referral to Dr. Tammi Klippel for consideration of adjuvant radiation. Scheduled to follow up with Dr. Risa Grill 12/18/16 at 1000 with labs the week prior.   Past/Anticipated interventions by medical oncology, if any: no  Weight changes, if any: no  Bowel/Bladder complaints, if any:  IPSS 14 with incomplete emptying, frequency, intermittency, urgency, rare weak stream, and nocturia x 2. Denies dysuria or hematuria. Denies leakage or incontinence. Denies hot flashes or night sweats.  Nausea/Vomiting, if any: no  Pain issues, if any:  no  SAFETY ISSUES:  Prior radiation? no  Pacemaker/ICD? no  Possible current pregnancy? no  Is the patient on methotrexate? no  Current Complaints / other details:  82 year old male. Married. Bone scan negative. CT negative for mets. NKDA. Believes his mother may have had cancer but, uncertain. Retired from [employer]

## 2016-08-14 ENCOUNTER — Encounter: Payer: Self-pay | Admitting: Medical Oncology

## 2016-08-14 ENCOUNTER — Ambulatory Visit
Admission: RE | Admit: 2016-08-14 | Discharge: 2016-08-14 | Disposition: A | Discharge status: Home / Self Care | Payer: Medicare Other | Source: Ambulatory Visit | Attending: Radiation Oncology | Admitting: Radiation Oncology

## 2016-08-14 ENCOUNTER — Encounter: Payer: Self-pay | Admitting: Radiation Oncology

## 2016-08-14 VITALS — BP 177/90 | HR 58 | Resp 18 | Ht 73.0 in | Wt 225.2 lb

## 2016-08-14 DIAGNOSIS — C61 Malignant neoplasm of prostate: Secondary | ICD-10-CM | POA: Insufficient documentation

## 2016-08-14 DIAGNOSIS — Z79899 Other long term (current) drug therapy: Secondary | ICD-10-CM | POA: Diagnosis not present

## 2016-08-14 DIAGNOSIS — E1142 Type 2 diabetes mellitus with diabetic polyneuropathy: Secondary | ICD-10-CM | POA: Diagnosis not present

## 2016-08-14 DIAGNOSIS — Z510 Encounter for antineoplastic radiation therapy: Secondary | ICD-10-CM | POA: Insufficient documentation

## 2016-08-14 DIAGNOSIS — E785 Hyperlipidemia, unspecified: Secondary | ICD-10-CM | POA: Insufficient documentation

## 2016-08-14 DIAGNOSIS — Z87442 Personal history of urinary calculi: Secondary | ICD-10-CM | POA: Diagnosis not present

## 2016-08-14 DIAGNOSIS — Z8546 Personal history of malignant neoplasm of prostate: Secondary | ICD-10-CM | POA: Insufficient documentation

## 2016-08-14 HISTORY — DX: Malignant neoplasm of prostate: C61

## 2016-08-14 NOTE — Progress Notes (Signed)
Radiation Oncology         (336) [phone] ________________________________  Initial outpatient Consultation  Name: Craig Jensen MRN: [id number]  Date: 08/14/2016  DOB: [birth year]  [id number], MD  Rana Snare, MD   REFERRING PHYSICIAN: Rana Snare, MD  DIAGNOSIS: 82 y.o. gentleman with stage T1c adenocarcinoma of the prostate with a Gleason's score of 4+4 and a PSA of 23    ICD-9-CM ICD-10-CM   1. Malignant neoplasm of prostate ([facility]) Craig Jensen is a 82 y.o. gentleman.  He was noted to have an elevated PSA of 23 by his primary care physician, Dr. Burnard Bunting.  Accordingly, he was referred for evaluation in urology by Dr. Risa Grill on 05/31/2016,  digital rectal examination was performed at that time revealing prostate about 100 grams and prostate right lobe larger.  The patient proceeded to transrectal ultrasound with limited biopsy of the prostate on 07/15/2016.  The prostate volume measured 70 cc.  Biopsies were positive bilaterally.  The maximum Gleason score was 4+4, and this was seen in left four lobes.   Bone scan on 08/07/2016 showed increased activity over both shoulders and right ankle, most likely degenerative, with no other abnormality noted to suggest metastatic disease. CT pelvis on 08/07/2016 showed soft tissue prominence involving the left aspect of the prostate gland posteriorly which could reflect bulging tumor from the peripheral zone. No CT findings suspicious for bladder, rectal home, or seminal vesicle involvement. There were small scattered pelvic sidewall and operator lymph nodes. There were no lower abdominal lymphadenopathy or worrisome bone lesions noted.   The patient has received Lupron and began taking Casodex on 08/09/2016.  The patient reviewed the biopsy results with his urologist and he has kindly been referred today for discussion of potential radiation treatment options.       PREVIOUS  RADIATION THERAPY: No  PAST MEDICAL HISTORY:  has a past medical history of Arthritis; Cataract; Diabetes mellitus without complication ([place]); Hyperlipidemia; Inguinal hernia; Kidney stones; Peripheral neuropathy ([place]); and Prostate cancer ([place]).    PAST SURGICAL HISTORY: Past Surgical History:  Procedure Laterality Date  . COLONOSCOPY    . EYE SURGERY Bilateral    cataract surgery w/ lens implant  . HERNIA REPAIR Left    inguinal hernia  . INNER EAR SURGERY Right   . KIDNEY STONE SURGERY    . PROSTATE BIOPSY    . REVERSE SHOULDER ARTHROPLASTY Right 01/11/2016   Procedure: RIGHT REVERSE SHOULDER ARTHROPLASTY;  Surgeon: Justice Britain, MD;  Location: [facility];  Service: Orthopedics;  Laterality: Right;  Marland Kitchen VASECTOMY      FAMILY HISTORY: family history includes Cancer in his mother; Heart disease in his brother and father; Stroke in his brother and mother.  SOCIAL HISTORY:  reports that he has never smoked. He has never used smokeless tobacco. He reports that he drinks alcohol. He reports that he does not use drugs.  ALLERGIES: Patient has no known allergies.  MEDICATIONS:  Current Outpatient Prescriptions  Medication Sig Dispense Refill  . bicalutamide (CASODEX) 50 MG tablet Take 50 mg by mouth daily.    . diazepam (VALIUM) 2 MG tablet Take 1 tablet (2 mg total) by mouth every 6 (six) hours as needed for muscle spasms or sedation. (Patient not taking: Reported on 08/14/2016) 30 tablet 1  . HYDROcodone-acetaminophen (NORCO) 5-325 MG tablet Take 1-2 tablets by mouth every 4 (four) hours as needed. (Patient not taking: Reported on 08/13/2016) 50 tablet  0  . ibuprofen (ADVIL,MOTRIN) 200 MG tablet Take 200 mg by mouth every 6 (six) hours as needed.    . ondansetron (ZOFRAN) 4 MG tablet Take 1 tablet (4 mg total) by mouth every 8 (eight) hours as needed for nausea or vomiting. (Patient not taking: Reported on 08/14/2016) 20 tablet 0   No current facility-administered medications for this encounter.      REVIEW OF SYSTEMS:  A 15 point review of systems is documented in the electronic medical record. This was obtained by the nursing staff. However, I reviewed this with the patient to discuss relevant findings and make appropriate changes.  The patient completed an IPSS and IIEF questionnaire.  His IPSS score was 14 indicating moderate urinary outflow obstructive symptoms including, incomplete emptying, frequency, intermittency, urgency, rare weak stream, and nocturia x 2. He denies nocturia, hematuria, leakage, or incontinence.  He indicated that his erectile function is not able to complete sexual activity.  On review of systems, the patient reports that he is doing well overall. He denies any chest pain, shortness of breath, cough, fevers, chills, night sweats, hot flashes, or unintended weight changes. He denies any bowel disturbances, and denies abdominal pain, nausea or vomiting. He denies any new musculoskeletal or joint aches or pains. He is active daily. A complete review of systems is obtained and is otherwise negative.   PHYSICAL EXAM:    height is 6\' 1"  (1.854 m) and weight is 225 lb 3.2 oz (102.2 kg). His blood pressure is 177/90 (abnormal) and his pulse is 58 (abnormal). His respiration is 18 and oxygen saturation is 100%.   Please note the digital rectal exam findings described above.  In general this is a well appearing Caucasian male in no acute distress. He's alert and oriented x4 and appropriate throughout the examination. Cardiopulmonary assessment is negative for acute distress and he exhibits normal effort.   KPS = 100  100 - Normal; no complaints; no evidence of disease. 90   - Able to carry on normal activity; minor signs or symptoms of disease. 80   - Normal activity with effort; some signs or symptoms of disease. 39   - Cares for self; unable to carry on normal activity or to do active work. 60   - Requires occasional assistance, but is able to care for most of his  personal needs. 50   - Requires considerable assistance and frequent medical care. 7   - Disabled; requires special care and assistance. 18   - Severely disabled; hospital admission is indicated although death not imminent. 61   - Very sick; hospital admission necessary; active supportive treatment necessary. 10   - Moribund; fatal processes progressing rapidly. 0     - Dead  Karnofsky DA, Abelmann [place], Craver LS and Burchenal [facility] [phone]) The use of the nitrogen mustards in the palliative treatment of carcinoma: with particular reference to bronchogenic carcinoma Cancer 1 [phone]   LABORATORY DATA:  Lab Results  Component Value Date   WBC 7.4 01/05/2016   HGB 13.8 01/05/2016   HCT 42.2 01/05/2016   MCV 92.3 01/05/2016   PLT 215 01/05/2016   Lab Results  Component Value Date   NA 141 01/05/2016   K 4.5 01/05/2016   CL 107 01/05/2016   CO2 27 01/05/2016   No results found for: ALT, AST, GGT, ALKPHOS, BILITOT   RADIOGRAPHY: Nm Bone Scan Whole Body  Result Date: 08/07/2016 CLINICAL DATA:  Prostate cancer. EXAM: NUCLEAR MEDICINE WHOLE BODY BONE SCAN  TECHNIQUE: Whole body anterior and posterior images were obtained approximately 3 hours after intravenous injection of radiopharmaceutical. RADIOPHARMACEUTICALS:  21 mCi Technetium-43m MDP IV COMPARISON:  CT 08/07/2016 . FINDINGS: Bilateral renal function and excretion. Increased activity noted about both shoulders. Increased activity noted over the right foot. These changes are most likely degenerative. Plain film evaluation of these regions can be obtained as needed. No findings noted to suggest metastatic disease. IMPRESSION: 1. Increased activity noted over both shoulders and right ankle. These changes are most likely degenerative. Plain film evaluation can be obtained for confirmation. 2. No other abnormality noted to suggest metastatic disease. Electronically Signed   By: Marcello Moores  Register   On: 08/07/2016 14:26       IMPRESSION: This  gentleman is a pleasant 82 year-old with stage T1c adenocarcinoma of the prostate with a Gleason's score of 4+4 and a PSA of 23.  His T-Stage, Gleason's Score, and PSA put him into the high risk group.  Accordingly he is eligible for a variety of potential treatment options including 2 years of ADT hormone therapy alone or in combination with  external beam radiation therapy.  PLAN: Today I reviewed the findings and workup thus far.  We discussed the natural history of prostate cancer.  We reviewed the the implications of T-stage, Gleason's Score, and PSA on decision-making and outcomes in prostate cancer.  We discussed radiation treatment in the management of prostate cancer with regard to the logistics and delivery of external beam radiation treatment as well as the logistics and delivery of CyberKnife radiation.  We compared and contrasted each of these approaches and also compared these against prostatectomy. We discussed how his age and high risk status both contribute to him not being an ideal surgical candidate for prostatectomy. We did not spend much time discussing seed boost since his prostate volume of 70 cc would likely preclude brachytherapy. The patient expressed interest in external beam radiotherapy.   The patient initially talked about waiting until April 2018 to make a decision on whether to move forward with radiation. He also indicated based on our discussion, he may elect to contact Dr. Risa Grill earlier in February or March with a decision to proceed with radiation treatment. I will share my findings with Dr. Risa Grill.   The patient is scheduled to follow up with Dr. Risa Grill 12/18/2016 with labs the week prior.   I enjoyed meeting with him today, and will look forward to participating in the care of this very nice gentleman, if decided.   I spent 55 minutes face to face with the patient and more than 50% of that time was spent in counseling and/or coordination of care.     ------------------------------------------------  Sheral Apley. Tammi Klippel, M.D.  This document serves as a record of services personally performed by Tyler Pita, MD. It was created on his behalf by Arlyce Harman, a trained medical scribe. The creation of this record is based on the scribe's personal observations and the provider's statements to them. This document has been checked and approved by the attending provider.

## 2016-08-14 NOTE — Progress Notes (Signed)
See progress note under physician encounter. 

## 2016-08-23 ENCOUNTER — Other Ambulatory Visit: Payer: Self-pay | Admitting: Radiation Oncology

## 2016-08-27 ENCOUNTER — Telehealth: Payer: Self-pay | Admitting: *Deleted

## 2016-08-27 NOTE — Telephone Encounter (Signed)
CALLED PATIENT TO INFORM OF GOLD SEED PLACEMENT ON 10/16/16 - ARRIVAL TIME - 8 AM @ DR. GRAPEY'S OFFICE AND HIS SIM ON 10-18-16 @ 2 PM @ DR. MANNING'S OFFICE, LVM FOR A RETURN CALL

## 2016-10-17 NOTE — Progress Notes (Signed)
  Radiation Oncology         (336) [phone] ________________________________  Name: Craig Jensen MRN: [id number]  Date: 10/18/2016  DOB: [birth year]  SIMULATION AND TREATMENT PLANNING NOTE    ICD-9-CM ICD-10-CM   1. Malignant neoplasm of prostate ([place]) 185 C61     DIAGNOSIS:  83 y.o. gentleman with stage T1c adenocarcinoma of the prostate with a Gleason's score of 4+4 and a PSA of 23  NARRATIVE:  The patient was brought to the [facility].  Identity was confirmed.  All relevant records and images related to the planned course of therapy were reviewed.  The patient freely provided informed written consent to proceed with treatment after reviewing the details related to the planned course of therapy. The consent form was witnessed and verified by the simulation staff.  Then, the patient was set-up in a stable reproducible supine position for radiation therapy.  A vacuum lock pillow device was custom fabricated to position his legs in a reproducible immobilized position.  Then, I performed a urethrogram under sterile conditions to identify the prostatic apex.  CT images were obtained.  Surface markings were placed.  The CT images were loaded into the planning software.  Then the prostate target and avoidance structures including the rectum, bladder, bowel and hips were contoured.  Treatment planning then occurred.  The radiation prescription was entered and confirmed.  A total of 1 complex treatment devices were fabricated. I have requested : Intensity Modulated Radiotherapy (IMRT) is medically necessary for this case for the following reason:  Rectal sparing.Marland Kitchen  PLAN:  The patient will receive 75 Gy in 40 fractions with pelvis to 45 Gy.  ________________________________  Sheral Apley. Tammi Klippel, M.D.

## 2016-10-18 ENCOUNTER — Ambulatory Visit
Admission: RE | Admit: 2016-10-18 | Discharge: 2016-10-18 | Disposition: A | Discharge status: Home / Self Care | Payer: Medicare Other | Source: Ambulatory Visit | Attending: Radiation Oncology | Admitting: Radiation Oncology

## 2016-10-18 DIAGNOSIS — Z510 Encounter for antineoplastic radiation therapy: Secondary | ICD-10-CM | POA: Diagnosis not present

## 2016-10-18 DIAGNOSIS — C61 Malignant neoplasm of prostate: Secondary | ICD-10-CM

## 2016-10-25 DIAGNOSIS — Z510 Encounter for antineoplastic radiation therapy: Secondary | ICD-10-CM | POA: Diagnosis not present

## 2016-10-29 ENCOUNTER — Ambulatory Visit
Admission: RE | Admit: 2016-10-29 | Discharge: 2016-10-29 | Disposition: A | Discharge status: Home / Self Care | Payer: Medicare Other | Source: Ambulatory Visit | Attending: Radiation Oncology | Admitting: Radiation Oncology

## 2016-10-29 DIAGNOSIS — Z510 Encounter for antineoplastic radiation therapy: Secondary | ICD-10-CM | POA: Diagnosis not present

## 2016-10-30 ENCOUNTER — Ambulatory Visit
Admission: RE | Admit: 2016-10-30 | Discharge: 2016-10-30 | Disposition: A | Discharge status: Home / Self Care | Payer: Medicare Other | Source: Ambulatory Visit | Attending: Radiation Oncology | Admitting: Radiation Oncology

## 2016-10-30 DIAGNOSIS — Z510 Encounter for antineoplastic radiation therapy: Secondary | ICD-10-CM | POA: Diagnosis not present

## 2016-10-31 ENCOUNTER — Encounter: Payer: Self-pay | Admitting: Medical Oncology

## 2016-10-31 ENCOUNTER — Ambulatory Visit
Admission: RE | Admit: 2016-10-31 | Discharge: 2016-10-31 | Disposition: A | Discharge status: Home / Self Care | Payer: Medicare Other | Source: Ambulatory Visit | Attending: Radiation Oncology | Admitting: Radiation Oncology

## 2016-10-31 DIAGNOSIS — Z510 Encounter for antineoplastic radiation therapy: Secondary | ICD-10-CM | POA: Diagnosis not present

## 2016-11-01 ENCOUNTER — Ambulatory Visit: Payer: Medicare Other

## 2016-11-01 ENCOUNTER — Ambulatory Visit: Admission: RE | Admit: 2016-11-01 | Payer: Medicare Other | Source: Ambulatory Visit | Admitting: Radiation Oncology

## 2016-11-01 ENCOUNTER — Telehealth: Payer: Self-pay | Admitting: Medical Oncology

## 2016-11-01 NOTE — Progress Notes (Signed)
Mr. Kadir called Sam- Dr. Johny Shears nurse to ask if he needs to continue taking Casodex. Per Dr. Cy Blamer note he would have patient take it for at least a month. I spoke with Nira Conn at  [facility] and she will discuss with Dr. Risa Grill. Per Nira Conn Dr. Risa Grill is aware that patient is currently receiving radiation treatments. I notified Sam of the above and she will contact patient.

## 2016-11-04 ENCOUNTER — Ambulatory Visit
Admission: RE | Admit: 2016-11-04 | Discharge: 2016-11-04 | Disposition: A | Discharge status: Home / Self Care | Payer: Medicare Other | Source: Ambulatory Visit | Attending: Radiation Oncology | Admitting: Radiation Oncology

## 2016-11-04 ENCOUNTER — Encounter: Payer: Self-pay | Admitting: Medical Oncology

## 2016-11-04 ENCOUNTER — Telehealth: Payer: Self-pay | Admitting: Radiation Oncology

## 2016-11-04 DIAGNOSIS — Z510 Encounter for antineoplastic radiation therapy: Secondary | ICD-10-CM | POA: Diagnosis not present

## 2016-11-04 NOTE — Progress Notes (Signed)
Received a call from [facility] Dr. Cy Blamer assistant, per Dr. Risa Grill Mr. Varkey can stop his Casodex. I notified Dr. Tammi Klippel and Sam.

## 2016-11-04 NOTE — Telephone Encounter (Signed)
Phoned patient. Explained that per Dr. Risa Grill he should stop taking his Casodex. Patient verbalized understanding.

## 2016-11-05 ENCOUNTER — Ambulatory Visit
Admission: RE | Admit: 2016-11-05 | Discharge: 2016-11-05 | Disposition: A | Discharge status: Home / Self Care | Payer: Medicare Other | Source: Ambulatory Visit | Attending: Radiation Oncology | Admitting: Radiation Oncology

## 2016-11-05 DIAGNOSIS — Z510 Encounter for antineoplastic radiation therapy: Secondary | ICD-10-CM | POA: Diagnosis not present

## 2016-11-06 ENCOUNTER — Ambulatory Visit
Admission: RE | Admit: 2016-11-06 | Discharge: 2016-11-06 | Disposition: A | Discharge status: Home / Self Care | Payer: Medicare Other | Source: Ambulatory Visit | Attending: Radiation Oncology | Admitting: Radiation Oncology

## 2016-11-06 VITALS — BP 165/99 | HR 62 | Resp 18 | Wt 229.0 lb

## 2016-11-06 DIAGNOSIS — C61 Malignant neoplasm of prostate: Secondary | ICD-10-CM

## 2016-11-06 DIAGNOSIS — Z510 Encounter for antineoplastic radiation therapy: Secondary | ICD-10-CM | POA: Diagnosis not present

## 2016-11-06 NOTE — Progress Notes (Signed)
Weight and vitals stable. Denies pain. Reports nocturia every three hours. Denies dysuria or hematuria. Describes his urine stream as strong and steady without difficulty emptying his bladder. Denies leakage or incontinence. Denies frequency or urgency. Reports loose bowels but, denies diarrhea. Denies fatigue.   BP (!) 165/99 (BP Location: Right Arm, Patient Position: Sitting, Cuff Size: Large)   Pulse 62   Resp 18   Wt 229 lb (103.9 kg)   SpO2 100%   BMI 30.21 kg/m  Wt Readings from Last 3 Encounters:  11/06/16 229 lb (103.9 kg)  08/14/16 225 lb 3.2 oz (102.2 kg)  01/11/16 220 lb (99.8 kg)

## 2016-11-06 NOTE — Progress Notes (Signed)
  Radiation Oncology         [phone]   Name: Craig Jensen MRN: [id number]   Date: 11/06/2016  DOB: 15-Aug-1930     Weekly Radiation Therapy Management    ICD-9-CM ICD-10-CM   1. Malignant neoplasm of prostate ([facility]) 185 C61     Current Dose: 9 Gy  Planned Dose:  75 Gy  Narrative The patient presents for routine under treatment assessment.  Weight and vitals stable. Denies pain. Reports nocturia every three hours. Denies dysuria or hematuria. Describes his urine stream as strong and steady without difficulty emptying his bladder. Denies leakage or incontinence. Denies frequency or urgency. Reports loose bowels but, denies diarrhea. Denies fatigue.  The patient is without complaint. Set-up films were reviewed. The chart was checked.  Physical Findings  weight is 229 lb (103.9 kg). His blood pressure is 165/99 (abnormal) and his pulse is 62. His respiration is 18 and oxygen saturation is 100%. . Weight essentially stable.  No significant changes.  Impression The patient is tolerating radiation.  Plan Continue treatment as planned.    Sheral Apley Tammi Klippel, M.D.  This document serves as a record of services personally performed by Tyler Pita, MD. It was created on his behalf by Arlyce Harman, a trained medical scribe. The creation of this record is based on the scribe's personal observations and the provider's statements to them. This document has been checked and approved by the attending provider.

## 2016-11-07 ENCOUNTER — Ambulatory Visit
Admission: RE | Admit: 2016-11-07 | Discharge: 2016-11-07 | Disposition: A | Discharge status: Home / Self Care | Payer: Medicare Other | Source: Ambulatory Visit | Attending: Radiation Oncology | Admitting: Radiation Oncology

## 2016-11-07 VITALS — BP 181/77 | HR 66 | Resp 18 | Wt 228.4 lb

## 2016-11-07 DIAGNOSIS — Z510 Encounter for antineoplastic radiation therapy: Secondary | ICD-10-CM | POA: Diagnosis not present

## 2016-11-07 DIAGNOSIS — C61 Malignant neoplasm of prostate: Secondary | ICD-10-CM

## 2016-11-07 NOTE — Progress Notes (Signed)
Weight and vitals stable. Denies pain. Reports nocturia every three hours. Denies dysuria or hematuria. Describes his urine stream as strong and steady without difficulty emptying his bladder. Denies leakage or incontinence. Denies frequency or urgency. Reports loose bowels but, denies diarrhea. Denies fatigue.   BP (!) 181/77 (BP Location: Left Arm, Patient Position: Sitting, Cuff Size: Normal)   Pulse 66   Resp 18   Wt 228 lb 6.4 oz (103.6 kg)   SpO2 100%   BMI 30.13 kg/m  Wt Readings from Last 3 Encounters:  11/07/16 228 lb 6.4 oz (103.6 kg)  11/06/16 229 lb (103.9 kg)  08/14/16 225 lb 3.2 oz (102.2 kg)

## 2016-11-07 NOTE — Progress Notes (Signed)
  Radiation Oncology         [phone]   Name: Craig Jensen MRN: [id number]   Date: 11/07/2016  DOB: 06-Feb-1930     Weekly Radiation Therapy Management    ICD-9-CM ICD-10-CM   1. Malignant neoplasm of prostate ([place]) 185 C61     Current Dose: 12.6 Gy  Planned Dose:  75 Gy  Narrative The patient presents for routine under treatment assessment.  Weight and vitals stable. Denies pain. Reports nocturia every three hours. Denies dysuria or hematuria. Describes his urine stream as strong and steady without difficulty emptying his bladder. Denies leakage or incontinence. Denies frequency or urgency. Reports loose bowels but, denies diarrhea. Denies fatigue.  The patient is without complaint. Set-up films were reviewed. The chart was checked.  Physical Findings  weight is 228 lb 6.4 oz (103.6 kg). His blood pressure is 181/77 (abnormal) and his pulse is 66. His respiration is 18 and oxygen saturation is 100%. . Weight essentially stable.  No significant changes.  Impression The patient is tolerating radiation.  Plan Continue treatment as planned.    Sheral Apley Tammi Klippel, M.D.  This document serves as a record of services personally performed by Tyler Pita, MD. It was created on his behalf by Bethann Humble, a trained medical scribe. The creation of this record is based on the scribe's personal observations and the provider's statements to them. This document has been checked and approved by the attending provider.

## 2016-11-08 ENCOUNTER — Ambulatory Visit
Admission: RE | Admit: 2016-11-08 | Discharge: 2016-11-08 | Disposition: A | Discharge status: Home / Self Care | Payer: Medicare Other | Source: Ambulatory Visit | Attending: Radiation Oncology | Admitting: Radiation Oncology

## 2016-11-08 DIAGNOSIS — Z510 Encounter for antineoplastic radiation therapy: Secondary | ICD-10-CM | POA: Diagnosis not present

## 2016-11-11 ENCOUNTER — Ambulatory Visit
Admission: RE | Admit: 2016-11-11 | Discharge: 2016-11-11 | Disposition: A | Discharge status: Home / Self Care | Payer: Medicare Other | Source: Ambulatory Visit | Attending: Radiation Oncology | Admitting: Radiation Oncology

## 2016-11-11 DIAGNOSIS — Z510 Encounter for antineoplastic radiation therapy: Secondary | ICD-10-CM | POA: Diagnosis not present

## 2016-11-12 ENCOUNTER — Ambulatory Visit: Payer: Medicare Other

## 2016-11-12 ENCOUNTER — Ambulatory Visit
Admission: RE | Admit: 2016-11-12 | Discharge: 2016-11-12 | Disposition: A | Discharge status: Home / Self Care | Payer: Medicare Other | Source: Ambulatory Visit | Attending: Radiation Oncology | Admitting: Radiation Oncology

## 2016-11-12 DIAGNOSIS — C61 Malignant neoplasm of prostate: Secondary | ICD-10-CM | POA: Insufficient documentation

## 2016-11-12 DIAGNOSIS — Z510 Encounter for antineoplastic radiation therapy: Secondary | ICD-10-CM | POA: Diagnosis present

## 2016-11-13 ENCOUNTER — Ambulatory Visit
Admission: RE | Admit: 2016-11-13 | Discharge: 2016-11-13 | Disposition: A | Discharge status: Home / Self Care | Payer: Medicare Other | Source: Ambulatory Visit | Attending: Radiation Oncology | Admitting: Radiation Oncology

## 2016-11-13 DIAGNOSIS — Z510 Encounter for antineoplastic radiation therapy: Secondary | ICD-10-CM | POA: Diagnosis not present

## 2016-11-14 ENCOUNTER — Ambulatory Visit
Admission: RE | Admit: 2016-11-14 | Discharge: 2016-11-14 | Disposition: A | Discharge status: Home / Self Care | Payer: Medicare Other | Source: Ambulatory Visit | Attending: Radiation Oncology | Admitting: Radiation Oncology

## 2016-11-14 DIAGNOSIS — Z510 Encounter for antineoplastic radiation therapy: Secondary | ICD-10-CM | POA: Diagnosis not present

## 2016-11-15 ENCOUNTER — Ambulatory Visit
Admission: RE | Admit: 2016-11-15 | Discharge: 2016-11-15 | Disposition: A | Discharge status: Home / Self Care | Payer: Medicare Other | Source: Ambulatory Visit | Attending: Radiation Oncology | Admitting: Radiation Oncology

## 2016-11-15 DIAGNOSIS — Z510 Encounter for antineoplastic radiation therapy: Secondary | ICD-10-CM | POA: Diagnosis not present

## 2016-11-18 ENCOUNTER — Ambulatory Visit
Admission: RE | Admit: 2016-11-18 | Discharge: 2016-11-18 | Disposition: A | Discharge status: Home / Self Care | Payer: Medicare Other | Source: Ambulatory Visit | Attending: Radiation Oncology | Admitting: Radiation Oncology

## 2016-11-18 DIAGNOSIS — Z510 Encounter for antineoplastic radiation therapy: Secondary | ICD-10-CM | POA: Diagnosis not present

## 2016-11-19 ENCOUNTER — Ambulatory Visit: Payer: Medicare Other

## 2016-11-19 ENCOUNTER — Ambulatory Visit
Admission: RE | Admit: 2016-11-19 | Discharge: 2016-11-19 | Disposition: A | Discharge status: Home / Self Care | Payer: Medicare Other | Source: Ambulatory Visit | Attending: Radiation Oncology | Admitting: Radiation Oncology

## 2016-11-19 DIAGNOSIS — Z510 Encounter for antineoplastic radiation therapy: Secondary | ICD-10-CM | POA: Diagnosis not present

## 2016-11-20 ENCOUNTER — Ambulatory Visit
Admission: RE | Admit: 2016-11-20 | Discharge: 2016-11-20 | Disposition: A | Discharge status: Home / Self Care | Payer: Medicare Other | Source: Ambulatory Visit | Attending: Radiation Oncology | Admitting: Radiation Oncology

## 2016-11-20 DIAGNOSIS — Z510 Encounter for antineoplastic radiation therapy: Secondary | ICD-10-CM | POA: Diagnosis not present

## 2016-11-21 ENCOUNTER — Ambulatory Visit
Admission: RE | Admit: 2016-11-21 | Discharge: 2016-11-21 | Disposition: A | Discharge status: Home / Self Care | Payer: Medicare Other | Source: Ambulatory Visit | Attending: Radiation Oncology | Admitting: Radiation Oncology

## 2016-11-21 DIAGNOSIS — Z510 Encounter for antineoplastic radiation therapy: Secondary | ICD-10-CM | POA: Diagnosis not present

## 2016-11-22 ENCOUNTER — Ambulatory Visit
Admission: RE | Admit: 2016-11-22 | Discharge: 2016-11-22 | Disposition: A | Discharge status: Home / Self Care | Payer: Medicare Other | Source: Ambulatory Visit | Attending: Radiation Oncology | Admitting: Radiation Oncology

## 2016-11-22 DIAGNOSIS — Z510 Encounter for antineoplastic radiation therapy: Secondary | ICD-10-CM | POA: Diagnosis not present

## 2016-11-25 ENCOUNTER — Ambulatory Visit
Admission: RE | Admit: 2016-11-25 | Discharge: 2016-11-25 | Disposition: A | Discharge status: Home / Self Care | Payer: Medicare Other | Source: Ambulatory Visit | Attending: Radiation Oncology | Admitting: Radiation Oncology

## 2016-11-25 DIAGNOSIS — Z510 Encounter for antineoplastic radiation therapy: Secondary | ICD-10-CM | POA: Diagnosis not present

## 2016-11-26 ENCOUNTER — Ambulatory Visit
Admission: RE | Admit: 2016-11-26 | Discharge: 2016-11-26 | Disposition: A | Discharge status: Home / Self Care | Payer: Medicare Other | Source: Ambulatory Visit | Attending: Radiation Oncology | Admitting: Radiation Oncology

## 2016-11-26 DIAGNOSIS — Z510 Encounter for antineoplastic radiation therapy: Secondary | ICD-10-CM | POA: Diagnosis not present

## 2016-11-27 ENCOUNTER — Ambulatory Visit
Admission: RE | Admit: 2016-11-27 | Discharge: 2016-11-27 | Disposition: A | Discharge status: Home / Self Care | Payer: Medicare Other | Source: Ambulatory Visit | Attending: Radiation Oncology | Admitting: Radiation Oncology

## 2016-11-27 DIAGNOSIS — Z510 Encounter for antineoplastic radiation therapy: Secondary | ICD-10-CM | POA: Diagnosis not present

## 2016-11-28 ENCOUNTER — Ambulatory Visit
Admission: RE | Admit: 2016-11-28 | Discharge: 2016-11-28 | Disposition: A | Discharge status: Home / Self Care | Payer: Medicare Other | Source: Ambulatory Visit | Attending: Radiation Oncology | Admitting: Radiation Oncology

## 2016-11-28 DIAGNOSIS — Z510 Encounter for antineoplastic radiation therapy: Secondary | ICD-10-CM | POA: Diagnosis not present

## 2016-11-29 ENCOUNTER — Ambulatory Visit
Admission: RE | Admit: 2016-11-29 | Discharge: 2016-11-29 | Disposition: A | Discharge status: Home / Self Care | Payer: Medicare Other | Source: Ambulatory Visit | Attending: Radiation Oncology | Admitting: Radiation Oncology

## 2016-11-29 DIAGNOSIS — Z510 Encounter for antineoplastic radiation therapy: Secondary | ICD-10-CM | POA: Diagnosis not present

## 2016-12-02 ENCOUNTER — Ambulatory Visit
Admission: RE | Admit: 2016-12-02 | Discharge: 2016-12-02 | Disposition: A | Discharge status: Home / Self Care | Payer: Medicare Other | Source: Ambulatory Visit | Attending: Radiation Oncology | Admitting: Radiation Oncology

## 2016-12-02 DIAGNOSIS — Z510 Encounter for antineoplastic radiation therapy: Secondary | ICD-10-CM | POA: Diagnosis not present

## 2016-12-03 ENCOUNTER — Ambulatory Visit
Admission: RE | Admit: 2016-12-03 | Discharge: 2016-12-03 | Disposition: A | Discharge status: Home / Self Care | Payer: Medicare Other | Source: Ambulatory Visit | Attending: Radiation Oncology | Admitting: Radiation Oncology

## 2016-12-03 ENCOUNTER — Ambulatory Visit: Payer: Medicare Other

## 2016-12-03 DIAGNOSIS — Z510 Encounter for antineoplastic radiation therapy: Secondary | ICD-10-CM | POA: Diagnosis not present

## 2016-12-04 ENCOUNTER — Ambulatory Visit: Payer: Medicare Other

## 2016-12-04 ENCOUNTER — Ambulatory Visit
Admission: RE | Admit: 2016-12-04 | Discharge: 2016-12-04 | Disposition: A | Discharge status: Home / Self Care | Payer: Medicare Other | Source: Ambulatory Visit | Attending: Radiation Oncology | Admitting: Radiation Oncology

## 2016-12-04 DIAGNOSIS — Z510 Encounter for antineoplastic radiation therapy: Secondary | ICD-10-CM | POA: Diagnosis not present

## 2016-12-05 ENCOUNTER — Ambulatory Visit
Admission: RE | Admit: 2016-12-05 | Discharge: 2016-12-05 | Disposition: A | Discharge status: Home / Self Care | Payer: Medicare Other | Source: Ambulatory Visit | Attending: Radiation Oncology | Admitting: Radiation Oncology

## 2016-12-05 DIAGNOSIS — Z510 Encounter for antineoplastic radiation therapy: Secondary | ICD-10-CM | POA: Diagnosis not present

## 2016-12-06 ENCOUNTER — Ambulatory Visit
Admission: RE | Admit: 2016-12-06 | Discharge: 2016-12-06 | Disposition: A | Discharge status: Home / Self Care | Payer: Medicare Other | Source: Ambulatory Visit | Attending: Radiation Oncology | Admitting: Radiation Oncology

## 2016-12-06 DIAGNOSIS — Z510 Encounter for antineoplastic radiation therapy: Secondary | ICD-10-CM | POA: Diagnosis not present

## 2016-12-09 ENCOUNTER — Other Ambulatory Visit: Payer: Self-pay | Admitting: Radiation Oncology

## 2016-12-09 ENCOUNTER — Ambulatory Visit
Admission: RE | Admit: 2016-12-09 | Discharge: 2016-12-09 | Disposition: A | Discharge status: Home / Self Care | Payer: Medicare Other | Source: Ambulatory Visit | Attending: Radiation Oncology | Admitting: Radiation Oncology

## 2016-12-09 ENCOUNTER — Telehealth: Payer: Self-pay | Admitting: Radiation Oncology

## 2016-12-09 VITALS — BP 118/69 | HR 96 | Temp 98.8°F | Resp 18 | Wt 226.6 lb

## 2016-12-09 DIAGNOSIS — Z510 Encounter for antineoplastic radiation therapy: Secondary | ICD-10-CM | POA: Diagnosis not present

## 2016-12-09 DIAGNOSIS — C61 Malignant neoplasm of prostate: Secondary | ICD-10-CM

## 2016-12-09 NOTE — Telephone Encounter (Signed)
Patient left message requesting return call. Phoned patient. He reports having severe diarrhea over the weekend. He reports 10-15 episodes per day despite taking Imodium. Encouraged patient to present to nursing following xrt for an evaluation. Patient verbalized understanding and appreciation for the return call.

## 2016-12-09 NOTE — Progress Notes (Addendum)
Patient presented to the clinic following radiation treatment for an evaluation. Patient reports that he has experienced increasing episodes of diarrhea since Thursday. Explains that Saturday and Sunday he moved his bowels at least 15 times each day. Reports he has constant painful gas. Denies blood in stool or rectal pain/irritation. Reports bowel movements are clear liquid to loose in consistency. Denies any change in his urinary symptoms except increased urinary frequency. Reports taking an Imodium tablet waiting a few hours to see if diarrhea resolved then taking another tablet. Reports he has not taken more than five tablets of Imodium in a 24 hour period. Reports on Saturday he ran a low grade fever of 100.3. Reports he hasn't been out of his house since Thursday and no one has been over to his home that could have been sick. Reports today he has eaten one biscuit with cheese and drank gatorade. Patient slightly orthostatic with two pound weight loss.   BP 118/69 (BP Location: Right Arm, Patient Position: Standing, Cuff Size: Normal)   Pulse 96   Temp 98.8 F (37.1 C) (Oral)   Resp 18   Wt 226 lb 9.6 oz (102.8 kg)   SpO2 100%   BMI 29.90 kg/m  Wt Readings from Last 3 Encounters:  12/09/16 226 lb 9.6 oz (102.8 kg)  11/07/16 228 lb 6.4 oz (103.6 kg)  11/06/16 229 lb (103.9 kg)

## 2016-12-09 NOTE — Progress Notes (Signed)
  Radiation Oncology         [phone]   Name: Craig Jensen MRN: [id number]   Date: 12/09/2016  DOB: 06/15/1930   Weekly Radiation Therapy Management  No diagnosis found.  Current Dose: 53Gy  Planned Dose:  75 Gy  Narrative The patient presents for a work in due to diarrhea that started last week and through the weekend was quite severe. He reports 15 BMs per day since Saturday. He denies any sick contacts, recent antibiotic use, nausea, vomiting, or abdominal pain. He's had a lot of abdominal distention and gassiness. He has had low grade temperatures with the Tmax being 100.3 Degrees F.   Physical Findings  weight is 226 lb 9.6 oz (102.8 kg). His oral temperature is 98.8 F (37.1 C). His blood pressure is 118/69 and his pulse is 96. His respiration is 18 and oxygen saturation is 100%.   In general this is a well appearing Caucasian male in no acute distress. He's alert and oriented x4 and appropriate throughout the examination. Cardiopulmonary assessment is negative for acute distress and he exhibits normal effort. The abdomen is somewhat distended.    Impression/Plan 1. Diarrhea. Although this could be radiation proctitis, I would like to rule out c. Diff colitis. He is in agreement and a specimen container was provided so he can collect a specimen and submit it tomorrow to the lab. We discussed the use of imodium in a scheduled fashion as well as BRAT diet choices. If he does not do well with these, we will plan to start lomotil. Although the patient is not tachycardic or having systolic hypotension, I'm concerned that he's dehydrated. To avoid a need for IVF hydration, he will increase po liquids and will have a salty snack this afternoon. We will follow up with him on Friday of this week for his scheduled under treatment visit. He will contact us sooner if he has questions or concerns. 2. Prostate Cancer. The patient will continue with daily radiotherapy otherwise as outlined.        Carola Rhine, PAC

## 2016-12-10 ENCOUNTER — Ambulatory Visit
Admission: RE | Admit: 2016-12-10 | Discharge: 2016-12-10 | Disposition: A | Discharge status: Home / Self Care | Payer: Medicare Other | Source: Ambulatory Visit | Attending: Radiation Oncology | Admitting: Radiation Oncology

## 2016-12-10 ENCOUNTER — Ambulatory Visit: Payer: Medicare Other

## 2016-12-10 ENCOUNTER — Telehealth: Payer: Self-pay | Admitting: Radiation Oncology

## 2016-12-10 DIAGNOSIS — Z510 Encounter for antineoplastic radiation therapy: Secondary | ICD-10-CM | POA: Diagnosis not present

## 2016-12-10 NOTE — Telephone Encounter (Signed)
Received call from [facility] in lab that the C-Diff specimen brought to them today was formed thus, the test can't be ran. Will inform Shona Simpson, PA-C of this finding.

## 2016-12-11 ENCOUNTER — Ambulatory Visit
Admission: RE | Admit: 2016-12-11 | Discharge: 2016-12-11 | Disposition: A | Discharge status: Home / Self Care | Payer: Medicare Other | Source: Ambulatory Visit | Attending: Radiation Oncology | Admitting: Radiation Oncology

## 2016-12-11 DIAGNOSIS — Z510 Encounter for antineoplastic radiation therapy: Secondary | ICD-10-CM | POA: Diagnosis not present

## 2016-12-12 ENCOUNTER — Ambulatory Visit
Admission: RE | Admit: 2016-12-12 | Discharge: 2016-12-12 | Disposition: A | Discharge status: Home / Self Care | Payer: Medicare Other | Source: Ambulatory Visit | Attending: Radiation Oncology | Admitting: Radiation Oncology

## 2016-12-12 DIAGNOSIS — Z510 Encounter for antineoplastic radiation therapy: Secondary | ICD-10-CM | POA: Diagnosis not present

## 2016-12-13 ENCOUNTER — Ambulatory Visit
Admission: RE | Admit: 2016-12-13 | Discharge: 2016-12-13 | Disposition: A | Discharge status: Home / Self Care | Payer: Medicare Other | Source: Ambulatory Visit | Attending: Radiation Oncology | Admitting: Radiation Oncology

## 2016-12-13 VITALS — BP 173/66 | HR 84 | Temp 99.6°F | Resp 18 | Wt 228.0 lb

## 2016-12-13 DIAGNOSIS — C61 Malignant neoplasm of prostate: Secondary | ICD-10-CM

## 2016-12-13 DIAGNOSIS — Z510 Encounter for antineoplastic radiation therapy: Secondary | ICD-10-CM | POA: Diagnosis not present

## 2016-12-13 NOTE — Progress Notes (Signed)
  Radiation Oncology         [phone] ________________________________  Name: Craig Jensen MRN: [id number]  Date: 12/13/2016  DOB: 1930-04-13    Weekly Radiation Therapy Management    ICD-9-CM ICD-10-CM   1. Malignant neoplasm of prostate ([facility]) 185 C61      Current Dose: 61 Gy     Planned Dose:  75 Gy  Narrative . . . . . . . . The patient presents for routine under treatment assessment.                                 Weight stable. BP elevated. Reports temperature last night was 100.3 but, today is 99.6. Reports episodes of diarrhea have decreased to 6-8 per day. Reports he hasn't had any Imodium since Wednesday. Reports he has decreased his oral intake due to taste changes and diarrhea. Reports increased gas and abdominal cramping. Scheduled to follow up with Dr. Risa Grill on Wednesday for lupron injection. Denies nausea or vomiting. Reports nocturia x 3. Reports a sting toward the end of each void. Denies hematuria. Reports a steady urine stream without difficulty emptying his bladder. Denies leakage or incontinence. Reports his energy level is slightly decreased. The patient brought in a stool sample. He denies shortness of breath or sore throat.                                 Set-up films were reviewed.                                 The chart was checked. Physical Findings. . .  weight is 228 lb (103.4 kg). His oral temperature is 99.6 F (37.6 C). His blood pressure is 173/66 (abnormal) and his pulse is 84. His respiration is 18 and oxygen saturation is 100%. . Weight essentially stable.  No significant changes. Lungs are clear to auscultation bilaterally. Heart has regular rate and rhythm. Abdomen soft, non-tender, normal bowel sounds. Oropharynx is clear. Impression . . . . . . . The patient is tolerating radiation. Plan . . . . . . . . . . . . Continue treatment as planned.  ________________________________   Blair Promise, PhD, MD  This document serves as a record  of services personally performed by Gery Pray, MD. It was created on his behalf by Arlyce Harman, a trained medical scribe. The creation of this record is based on the scribe's personal observations and the provider's statements to them. This document has been checked and approved by the attending provider.

## 2016-12-13 NOTE — Progress Notes (Signed)
Weight stable. BP elevated. Reports temperature last night was 100.3 but, today is 99.6. Reports episodes of diarrhea have decreased to 6-8 per day. Reports he hasn't had any Imodium since Wednesday. Reports he has decreased his oral intake due to taste changes and diarrhea. Reports increased gas and abdominal cramping. Scheduled to follow up with Dr. Risa Grill on Wednesday for lupron injection. Denies nausea or vomiting. Reports nocturia x 3. Reports a sting at during toward the end of each void. Denies hematuria. Reports a steady urine stream without difficulty emptying his bladder. Denies leakage or incontinence. Reports his energy level is slightly decreased.   BP (!) 173/66 (BP Location: Left Arm, Patient Position: Sitting, Cuff Size: Large)   Pulse 84   Temp 99.6 F (37.6 C) (Oral)   Resp 18   Wt 228 lb (103.4 kg)   SpO2 100%   BMI 30.08 kg/m  Wt Readings from Last 3 Encounters:  12/13/16 228 lb (103.4 kg)  12/09/16 226 lb 9.6 oz (102.8 kg)  11/07/16 228 lb 6.4 oz (103.6 kg)

## 2016-12-16 ENCOUNTER — Ambulatory Visit
Admission: RE | Admit: 2016-12-16 | Discharge: 2016-12-16 | Disposition: A | Discharge status: Home / Self Care | Payer: Medicare Other | Source: Ambulatory Visit | Attending: Radiation Oncology | Admitting: Radiation Oncology

## 2016-12-16 DIAGNOSIS — Z510 Encounter for antineoplastic radiation therapy: Secondary | ICD-10-CM | POA: Diagnosis not present

## 2016-12-17 ENCOUNTER — Telehealth: Payer: Self-pay | Admitting: Radiation Oncology

## 2016-12-17 ENCOUNTER — Ambulatory Visit
Admission: RE | Admit: 2016-12-17 | Discharge: 2016-12-17 | Disposition: A | Discharge status: Home / Self Care | Payer: Medicare Other | Source: Ambulatory Visit | Attending: Radiation Oncology | Admitting: Radiation Oncology

## 2016-12-17 DIAGNOSIS — Z510 Encounter for antineoplastic radiation therapy: Secondary | ICD-10-CM | POA: Diagnosis not present

## 2016-12-17 NOTE — Telephone Encounter (Signed)
Patient left message requesting return call. Phoned patient back. Patient reports constipation x 5 days. Instructed patient to use a Fleets enema to evacuate his lower bowel followed by magnesium citrate. Encouraged patient to increase fluid intake by drinking water, gatorade or powerade (for electrolytes). Patient verbalized understanding and expressed appreciation for the return call. Patient understands to contact this RN with any future needs.

## 2016-12-18 ENCOUNTER — Ambulatory Visit
Admission: RE | Admit: 2016-12-18 | Discharge: 2016-12-18 | Disposition: A | Discharge status: Home / Self Care | Payer: Medicare Other | Source: Ambulatory Visit | Attending: Radiation Oncology | Admitting: Radiation Oncology

## 2016-12-18 DIAGNOSIS — Z510 Encounter for antineoplastic radiation therapy: Secondary | ICD-10-CM | POA: Diagnosis not present

## 2016-12-19 ENCOUNTER — Ambulatory Visit
Admission: RE | Admit: 2016-12-19 | Discharge: 2016-12-19 | Disposition: A | Discharge status: Home / Self Care | Payer: Medicare Other | Source: Ambulatory Visit | Attending: Radiation Oncology | Admitting: Radiation Oncology

## 2016-12-19 DIAGNOSIS — Z510 Encounter for antineoplastic radiation therapy: Secondary | ICD-10-CM | POA: Diagnosis not present

## 2016-12-20 ENCOUNTER — Ambulatory Visit
Admission: RE | Admit: 2016-12-20 | Discharge: 2016-12-20 | Disposition: A | Discharge status: Home / Self Care | Payer: Medicare Other | Source: Ambulatory Visit | Attending: Radiation Oncology | Admitting: Radiation Oncology

## 2016-12-20 DIAGNOSIS — Z510 Encounter for antineoplastic radiation therapy: Secondary | ICD-10-CM | POA: Diagnosis not present

## 2016-12-23 ENCOUNTER — Ambulatory Visit: Payer: Medicare Other

## 2016-12-23 ENCOUNTER — Ambulatory Visit
Admission: RE | Admit: 2016-12-23 | Discharge: 2016-12-23 | Disposition: A | Discharge status: Home / Self Care | Payer: Medicare Other | Source: Ambulatory Visit | Attending: Radiation Oncology | Admitting: Radiation Oncology

## 2016-12-23 DIAGNOSIS — Z510 Encounter for antineoplastic radiation therapy: Secondary | ICD-10-CM | POA: Diagnosis not present

## 2016-12-24 ENCOUNTER — Ambulatory Visit
Admission: RE | Admit: 2016-12-24 | Discharge: 2016-12-24 | Disposition: A | Discharge status: Home / Self Care | Payer: Medicare Other | Source: Ambulatory Visit | Attending: Radiation Oncology | Admitting: Radiation Oncology

## 2016-12-24 DIAGNOSIS — Z510 Encounter for antineoplastic radiation therapy: Secondary | ICD-10-CM | POA: Diagnosis not present

## 2016-12-26 ENCOUNTER — Encounter: Payer: Self-pay | Admitting: Radiation Oncology

## 2016-12-26 NOTE — Progress Notes (Signed)
  Radiation Oncology         [phone] ________________________________  Name: Craig Jensen MRN: [id number]  Date: 12/26/2016  DOB: 09-Nov-1929  End of Treatment Note  Diagnosis:  83 y.o. gentleman with stage T1c adenocarcinoma of the prostate with a Gleason's score of 4+4 and a PSA of 23     Indication for treatment:  Curative       Radiation treatment dates:  10/29/16-12/24/16  Site/dose:  1) Prostate initial pelvic nodes/ 45 Gy in 25 fractions   2) Prostate IMRT boost/ 30 Gy in 15 fractions  Beams/energy:  1) IMRT/ 6X    2) IMRT/ 6X  Narrative: The patient tolerated radiation treatment relatively well.  During treatment, he reported diarrhea 6-8 times per day. He also reported increased gas, abdominal cramping, and dysuria.  Plan: The patient has completed radiation treatment. The patient will return to radiation oncology clinic for routine followup in one month. I advised him to call or return sooner if he has any questions or concerns related to his recovery or treatment. ________________________________  Sheral Apley. Tammi Klippel, M.D.   This document serves as a record of services personally performed by Tyler Pita, MD. It was created on his behalf by Bethann Humble, a trained medical scribe. The creation of this record is based on the scribe's personal observations and the provider's statements to them. This document has been checked and approved by the attending provider.

## 2017-01-11 ENCOUNTER — Emergency Department (HOSPITAL_BASED_OUTPATIENT_CLINIC_OR_DEPARTMENT_OTHER): Payer: Medicare Other

## 2017-01-11 ENCOUNTER — Encounter (HOSPITAL_BASED_OUTPATIENT_CLINIC_OR_DEPARTMENT_OTHER): Payer: Self-pay | Admitting: Emergency Medicine

## 2017-01-11 ENCOUNTER — Inpatient Hospital Stay (HOSPITAL_BASED_OUTPATIENT_CLINIC_OR_DEPARTMENT_OTHER)
Admission: EM | Admit: 2017-01-11 | Discharge: 2017-01-14 | DRG: 394 | Disposition: A | Discharge status: Skilled Nursing Facility | Payer: Medicare Other | Attending: Family Medicine | Admitting: Family Medicine

## 2017-01-11 DIAGNOSIS — Z961 Presence of intraocular lens: Secondary | ICD-10-CM | POA: Diagnosis present

## 2017-01-11 DIAGNOSIS — E860 Dehydration: Secondary | ICD-10-CM | POA: Diagnosis present

## 2017-01-11 DIAGNOSIS — R197 Diarrhea, unspecified: Secondary | ICD-10-CM | POA: Diagnosis not present

## 2017-01-11 DIAGNOSIS — Z96611 Presence of right artificial shoulder joint: Secondary | ICD-10-CM | POA: Diagnosis present

## 2017-01-11 DIAGNOSIS — C61 Malignant neoplasm of prostate: Secondary | ICD-10-CM | POA: Diagnosis present

## 2017-01-11 DIAGNOSIS — I10 Essential (primary) hypertension: Secondary | ICD-10-CM | POA: Diagnosis present

## 2017-01-11 DIAGNOSIS — E46 Unspecified protein-calorie malnutrition: Secondary | ICD-10-CM | POA: Diagnosis present

## 2017-01-11 DIAGNOSIS — E876 Hypokalemia: Secondary | ICD-10-CM

## 2017-01-11 DIAGNOSIS — Z79899 Other long term (current) drug therapy: Secondary | ICD-10-CM | POA: Diagnosis not present

## 2017-01-11 DIAGNOSIS — K529 Noninfective gastroenteritis and colitis, unspecified: Secondary | ICD-10-CM | POA: Diagnosis not present

## 2017-01-11 DIAGNOSIS — R935 Abnormal findings on diagnostic imaging of other abdominal regions, including retroperitoneum: Secondary | ICD-10-CM | POA: Diagnosis not present

## 2017-01-11 DIAGNOSIS — Z6828 Body mass index (BMI) 28.0-28.9, adult: Secondary | ICD-10-CM | POA: Diagnosis not present

## 2017-01-11 DIAGNOSIS — G479 Sleep disorder, unspecified: Secondary | ICD-10-CM | POA: Diagnosis present

## 2017-01-11 DIAGNOSIS — R350 Frequency of micturition: Secondary | ICD-10-CM | POA: Diagnosis present

## 2017-01-11 DIAGNOSIS — R1084 Generalized abdominal pain: Secondary | ICD-10-CM

## 2017-01-11 DIAGNOSIS — Z66 Do not resuscitate: Secondary | ICD-10-CM | POA: Diagnosis present

## 2017-01-11 DIAGNOSIS — Z9841 Cataract extraction status, right eye: Secondary | ICD-10-CM | POA: Diagnosis not present

## 2017-01-11 DIAGNOSIS — K520 Gastroenteritis and colitis due to radiation: Secondary | ICD-10-CM | POA: Diagnosis present

## 2017-01-11 DIAGNOSIS — Z9842 Cataract extraction status, left eye: Secondary | ICD-10-CM

## 2017-01-11 DIAGNOSIS — K627 Radiation proctitis: Principal | ICD-10-CM | POA: Diagnosis present

## 2017-01-11 LAB — COMPREHENSIVE METABOLIC PANEL
ALT: 14 U/L — ABNORMAL LOW (ref 17–63)
AST: 18 U/L (ref 15–41)
Albumin: 2.4 g/dL — ABNORMAL LOW (ref 3.5–5.0)
Alkaline Phosphatase: 78 U/L (ref 38–126)
Anion gap: 10 (ref 5–15)
BUN: 14 mg/dL (ref 6–20)
CO2: 24 mmol/L (ref 22–32)
Calcium: 8.1 mg/dL — ABNORMAL LOW (ref 8.9–10.3)
Chloride: 104 mmol/L (ref 101–111)
Creatinine, Ser: 0.67 mg/dL (ref 0.61–1.24)
GFR calc Af Amer: >60 mL/min (ref >60)
GFR calc non Af Amer: >60 mL/min (ref >60)
Glucose, Bld: 152 mg/dL — ABNORMAL HIGH (ref 65–99)
Potassium: 2.8 mmol/L — ABNORMAL LOW (ref 3.5–5.1)
Sodium: 138 mmol/L (ref 135–145)
Total Bilirubin: 1.1 mg/dL (ref 0.3–1.2)
Total Protein: 6 g/dL — ABNORMAL LOW (ref 6.5–8.1)

## 2017-01-11 LAB — URINALYSIS, MICROSCOPIC (REFLEX): RBC / HPF: NONE SEEN RBC/hpf (ref 0–5)

## 2017-01-11 LAB — CBC WITH DIFFERENTIAL/PLATELET
Basophils Absolute: 0 10*3/uL (ref 0.0–0.1)
Basophils Relative: 0 %
Eosinophils Absolute: 0.1 10*3/uL (ref 0.0–0.7)
Eosinophils Relative: 1 %
HCT: 33.5 % — ABNORMAL LOW (ref 39.0–52.0)
Hemoglobin: 11.4 g/dL — ABNORMAL LOW (ref 13.0–17.0)
Lymphocytes Relative: 20 %
Lymphs Abs: 1.4 10*3/uL (ref 0.7–4.0)
MCH: 31.6 pg (ref 26.0–34.0)
MCHC: 34 g/dL (ref 30.0–36.0)
MCV: 92.8 fL (ref 78.0–100.0)
Monocytes Absolute: 1.1 10*3/uL — ABNORMAL HIGH (ref 0.1–1.0)
Monocytes Relative: 15 %
Neutro Abs: 4.7 10*3/uL (ref 1.7–7.7)
Neutrophils Relative %: 65 %
Platelets: 377 10*3/uL (ref 150–400)
RBC: 3.61 MIL/uL — ABNORMAL LOW (ref 4.22–5.81)
RDW: 15 % (ref 11.5–15.5)
WBC: 7.3 10*3/uL (ref 4.0–10.5)

## 2017-01-11 LAB — C DIFFICILE QUICK SCREEN W PCR REFLEX
C Diff antigen: NEGATIVE
C Diff interpretation: NOT DETECTED
C Diff toxin: NEGATIVE

## 2017-01-11 LAB — URINALYSIS, ROUTINE W REFLEX MICROSCOPIC
Bilirubin Urine: NEGATIVE
Glucose, UA: NEGATIVE mg/dL
Hgb urine dipstick: NEGATIVE
Ketones, ur: 40 mg/dL — AB
Nitrite: NEGATIVE
Protein, ur: NEGATIVE mg/dL
Specific Gravity, Urine: 1.029 (ref 1.005–1.030)
pH: 6.5 (ref 5.0–8.0)

## 2017-01-11 LAB — MAGNESIUM: Magnesium: 1.6 mg/dL — ABNORMAL LOW (ref 1.7–2.4)

## 2017-01-11 LAB — LIPASE, BLOOD: Lipase: 12 U/L (ref 11–51)

## 2017-01-11 MED ORDER — POTASSIUM CHLORIDE CRYS ER 20 MEQ PO TBCR: 40.0000 meq | EXTENDED_RELEASE_TABLET | ORAL | Status: DC

## 2017-01-11 MED ORDER — OXYCODONE HCL 5 MG PO TABS: 5.0000 mg | ORAL_TABLET | ORAL | Status: DC | PRN

## 2017-01-11 MED ORDER — TRAZODONE HCL 50 MG PO TABS
25.0000 mg | ORAL_TABLET | Freq: Every evening | ORAL | Status: DC | PRN
  Administered 2017-01-11 – 2017-01-12 (×2): 25 mg via ORAL
  Filled 2017-01-11 (×2): qty 1

## 2017-01-11 MED ORDER — IOPAMIDOL (ISOVUE-300) INJECTION 61%
100.0000 mL | Freq: Once | INTRAVENOUS | Status: AC | PRN
  Administered 2017-01-11: 100 mL via INTRAVENOUS

## 2017-01-11 MED ORDER — SODIUM CHLORIDE 0.9 % IV BOLUS (SEPSIS)
500.0000 mL | Freq: Once | INTRAVENOUS | Status: AC
  Administered 2017-01-11: 500 mL via INTRAVENOUS

## 2017-01-11 MED ORDER — ONDANSETRON HCL 4 MG PO TABS: 4.0000 mg | ORAL_TABLET | Freq: Four times a day (QID) | ORAL | Status: DC | PRN

## 2017-01-11 MED ORDER — MAGNESIUM SULFATE IN D5W 1-5 GM/100ML-% IV SOLN
INTRAVENOUS | Status: AC
  Administered 2017-01-11: 1 g
  Filled 2017-01-11: qty 100

## 2017-01-11 MED ORDER — KCL-LACTATED RINGERS-D5W 20 MEQ/L IV SOLN
INTRAVENOUS | Status: DC
  Administered 2017-01-11 – 2017-01-12 (×3): via INTRAVENOUS
  Filled 2017-01-11 (×5): qty 1000

## 2017-01-11 MED ORDER — PIPERACILLIN-TAZOBACTAM 3.375 G IVPB 30 MIN
3.3750 g | Freq: Once | INTRAVENOUS | Status: AC
  Administered 2017-01-11: 3.375 g via INTRAVENOUS
  Filled 2017-01-11 (×2): qty 50

## 2017-01-11 MED ORDER — ENOXAPARIN SODIUM 40 MG/0.4ML ~~LOC~~ SOLN
40.0000 mg | SUBCUTANEOUS | Status: DC
  Administered 2017-01-11 – 2017-01-13 (×3): 40 mg via SUBCUTANEOUS
  Filled 2017-01-11 (×3): qty 0.4

## 2017-01-11 MED ORDER — METRONIDAZOLE IN NACL 5-0.79 MG/ML-% IV SOLN
500.0000 mg | Freq: Once | INTRAVENOUS | Status: AC
  Administered 2017-01-11: 500 mg via INTRAVENOUS
  Filled 2017-01-11: qty 100

## 2017-01-11 MED ORDER — MAGNESIUM SULFATE 50 % IJ SOLN: 1.0000 g | Freq: Once | INTRAMUSCULAR | Status: DC

## 2017-01-11 MED ORDER — AMLODIPINE BESYLATE 5 MG PO TABS
5.0000 mg | ORAL_TABLET | Freq: Every day | ORAL | Status: DC
  Administered 2017-01-11 – 2017-01-14 (×4): 5 mg via ORAL
  Filled 2017-01-11 (×4): qty 1

## 2017-01-11 MED ORDER — MORPHINE SULFATE (PF) 4 MG/ML IV SOLN
4.0000 mg | Freq: Once | INTRAVENOUS | Status: AC
  Administered 2017-01-11: 4 mg via INTRAVENOUS
  Filled 2017-01-11: qty 1

## 2017-01-11 MED ORDER — ONDANSETRON HCL 4 MG/2ML IJ SOLN: 4.0000 mg | Freq: Four times a day (QID) | INTRAMUSCULAR | Status: DC | PRN

## 2017-01-11 MED ORDER — ONDANSETRON HCL 4 MG/2ML IJ SOLN
4.0000 mg | Freq: Once | INTRAMUSCULAR | Status: AC
  Administered 2017-01-11: 4 mg via INTRAVENOUS
  Filled 2017-01-11: qty 2

## 2017-01-11 MED ORDER — POTASSIUM CHLORIDE 10 MEQ/100ML IV SOLN
10.0000 meq | INTRAVENOUS | Status: AC
  Administered 2017-01-11 (×3): 10 meq via INTRAVENOUS
  Filled 2017-01-11 (×3): qty 100

## 2017-01-11 NOTE — ED Provider Notes (Signed)
[facility] DEPT Provider Note   CSN: [id number] Arrival date & time: 01/11/17  1040     History   Chief Complaint Chief Complaint  Patient presents with  . Diarrhea    HPI Craig Jensen is a 83 y.o. male.  HPI Patient completed radiation treatment for prostate cancer. He's had several weeks of abdominal discomfort and persistent diarrhea. States he's having 10-20 loose stools daily. Associated with abdominal cramping. Denies any blood in the stool. Denies fever or chills. States he is taking Imodium without relief. He's had increased urinary frequency but denies dysuria. Increased fatigue and difficulty sleeping. He also has had decreased oral intake.  Past Medical History:  Diagnosis Date  . Arthritis   . Cataract   . Diabetes mellitus without complication ([place])   . Hyperlipidemia   . Inguinal hernia   . Kidney stones   . Peripheral neuropathy   . Prostate cancer ([place])   . Prostate cancer [facility])     Patient Active Problem List   Diagnosis Date Noted  . Colitis 01/11/2017  . Hypokalemia 01/11/2017  . Malignant neoplasm of prostate ([place]) 08/14/2016  . S/p reverse total shoulder arthroplasty 01/11/2016    Past Surgical History:  Procedure Laterality Date  . COLONOSCOPY    . EYE SURGERY Bilateral    cataract surgery w/ lens implant  . HERNIA REPAIR Left    inguinal hernia  . INNER EAR SURGERY Right   . KIDNEY STONE SURGERY    . PROSTATE BIOPSY    . REVERSE SHOULDER ARTHROPLASTY Right 01/11/2016   Procedure: RIGHT REVERSE SHOULDER ARTHROPLASTY;  Surgeon: Justice Britain, MD;  Location: [facility];  Service: Orthopedics;  Laterality: Right;  Marland Kitchen VASECTOMY         Home Medications    Prior to Admission medications   Medication Sig Start Date End Date Taking? Authorizing Provider  ibuprofen (ADVIL,MOTRIN) 200 MG tablet Take 200 mg by mouth every 6 (six) hours as needed.   Yes [provider]  loperamide (IMODIUM A-D) 2 MG tablet Take 2 mg by mouth 4 (four)  times daily as needed for diarrhea or loose stools.   Yes [provider]  diazepam (VALIUM) 2 MG tablet Take 1 tablet (2 mg total) by mouth every 6 (six) hours as needed for muscle spasms or sedation. Patient not taking: Reported on 01/11/2017 01/12/16   Shuford, Olivia Mackie, PA-C  HYDROcodone-acetaminophen (NORCO) 5-325 MG tablet Take 1-2 tablets by mouth every 4 (four) hours as needed. Patient not taking: Reported on 01/11/2017 01/12/16   Shuford, Olivia Mackie, PA-C  ondansetron (ZOFRAN) 4 MG tablet Take 1 tablet (4 mg total) by mouth every 8 (eight) hours as needed for nausea or vomiting. Patient not taking: Reported on 08/14/2016 01/12/16   Shuford, Olivia Mackie, PA-C    Family History Family History  Problem Relation Age of Onset  . Cancer Mother     uncertain  . Stroke Mother   . Heart disease Father   . Stroke Brother     brain aneurysm  . Heart disease Brother     Social History Social History  Substance Use Topics  . Smoking status: Never Smoker  . Smokeless tobacco: Never Used  . Alcohol use Yes     Comment: very rare     Allergies   Patient has no known allergies.   Review of Systems Review of Systems  Constitutional: Positive for activity change, appetite change and fatigue. Negative for chills and fever.  Respiratory: Negative for shortness of  breath.   Cardiovascular: Negative for chest pain.  Gastrointestinal: Positive for abdominal pain and diarrhea. Negative for blood in stool, constipation, nausea and vomiting.  Genitourinary: Positive for frequency. Negative for dysuria, flank pain and hematuria.  Musculoskeletal: Negative for back pain, myalgias, neck pain and neck stiffness.  Skin: Negative for rash and wound.  Neurological: Positive for weakness (generalized). Negative for dizziness, light-headedness and headaches.  All other systems reviewed and are negative.    Physical Exam Updated Vital Signs BP (!) 147/77 (BP Location: Left Arm)   Pulse 71   Temp 98.3 F  (36.8 C) (Oral)   Resp 18   Ht 6\' 1"  (1.854 m)   Wt 218 lb (98.9 kg)   SpO2 98%   BMI 28.76 kg/m   Physical Exam  Constitutional: He is oriented to person, place, and time. He appears well-developed and well-nourished.  HENT:  Head: Normocephalic and atraumatic.  Mouth/Throat: Oropharynx is clear and moist.  Eyes: EOM are normal. Pupils are equal, round, and reactive to light.  Neck: Normal range of motion. Neck supple.  Cardiovascular: Normal rate and regular rhythm.   Pulmonary/Chest: Effort normal and breath sounds normal. No respiratory distress. He has no wheezes. He has no rales. He exhibits no tenderness.  Abdominal: Soft. Bowel sounds are normal. He exhibits distension. He exhibits no mass. There is tenderness (diffuse abdominal tenderness). There is no rebound and no guarding.  Musculoskeletal: Normal range of motion. He exhibits no edema or tenderness.  2+ bilateral lower extremity pitting edema. No asymmetry or tenderness.  Neurological: He is alert and oriented to person, place, and time.  Skin: Skin is warm and dry. Capillary refill takes less than 2 seconds. No rash noted. No erythema.  Psychiatric: He has a normal mood and affect. His behavior is normal.  Nursing note and vitals reviewed.    ED Treatments / Results  Labs (all labs ordered are listed, but only abnormal results are displayed) Labs Reviewed  CBC WITH DIFFERENTIAL/PLATELET - Abnormal; Notable for the following:       Result Value   RBC 3.61 (*)    Hemoglobin 11.4 (*)    HCT 33.5 (*)    Monocytes Absolute 1.1 (*)    All other components within normal limits  COMPREHENSIVE METABOLIC PANEL - Abnormal; Notable for the following:    Potassium 2.8 (*)    Glucose, Bld 152 (*)    Calcium 8.1 (*)    Total Protein 6.0 (*)    Albumin 2.4 (*)    ALT 14 (*)    All other components within normal limits  URINALYSIS, ROUTINE W REFLEX MICROSCOPIC - Abnormal; Notable for the following:    Ketones, ur 40 (*)      Leukocytes, UA SMALL (*)    All other components within normal limits  MAGNESIUM - Abnormal; Notable for the following:    Magnesium 1.6 (*)    All other components within normal limits  URINALYSIS, MICROSCOPIC (REFLEX) - Abnormal; Notable for the following:    Bacteria, UA RARE (*)    Squamous Epithelial / LPF 0-5 (*)    All other components within normal limits  COMPREHENSIVE METABOLIC PANEL - Abnormal; Notable for the following:    Potassium 3.4 (*)    Glucose, Bld 148 (*)    Calcium 7.8 (*)    Total Protein 5.8 (*)    Albumin 2.3 (*)    ALT 14 (*)    All other components within normal limits  CBC -  Abnormal; Notable for the following:    RBC 3.43 (*)    Hemoglobin 10.7 (*)    HCT 32.0 (*)    All other components within normal limits  C DIFFICILE QUICK SCREEN W PCR REFLEX  GASTROINTESTINAL PANEL BY PCR, STOOL (REPLACES STOOL CULTURE)  LIPASE, BLOOD    EKG  EKG Interpretation  Date/Time:  Saturday Jan 11 2017 12:49:44 EDT Ventricular Rate:  72 PR Interval:    QRS Duration: 103 QT Interval:  426 QTC Calculation: 467 R Axis:   -21 Text Interpretation:  Sinus rhythm Borderline left axis deviation Low voltage, precordial leads Abnormal R-wave progression, early transition Consider anterior infarct Borderline ST depression, lateral leads Confirmed by Lita Mains  MD, Ava Tangney ([id number]) on 01/12/2017 10:46:20 AM       Radiology Ct Abdomen Pelvis W Contrast  Result Date: 01/11/2017 CLINICAL DATA:  Chronic lower abdominopelvic pain with diarrhea. Recently completed pelvic radiation for prostate cancer. Associated weakness and fatigue. EXAM: CT ABDOMEN AND PELVIS WITH CONTRAST TECHNIQUE: Multidetector CT imaging of the abdomen and pelvis was performed using the standard protocol following bolus administration of intravenous contrast. CONTRAST:  150mL ISOVUE-300 IOPAMIDOL (ISOVUE-300) INJECTION 61% COMPARISON:  08/07/2016 FINDINGS: Lower chest: Normal heart size. No pericardial or  pleural effusion. Minor basilar scarring. No lower lobe pneumonia. Degenerative changes of the lower thoracic spine with osteophytes on the right. Hepatobiliary: No focal liver abnormality is seen. No gallstones, gallbladder wall thickening, or biliary dilatation. Pancreas: Unremarkable. No pancreatic ductal dilatation or surrounding inflammatory changes. Spleen: Normal in size without focal abnormality. Adrenals/Urinary Tract: Normal adrenal glands for age. No renal obstruction, hydronephrosis, or obstructing ureteral calculus. Right renal parapelvic cyst measures 12 mm. Bladder demonstrates small diverticula on the right. Stomach/Bowel: Negative for bowel obstruction, significant dilatation, ileus, or free air. Appendix is not visualized. No fluid collection, abscess, or hemorrhage. Moderate volume of retained formed stool throughout the colon. Diverticulosis of the left descending colon and sigmoid. In the pelvis, the distal sigmoid and rectum demonstrate circumferential wall thickening with some mucosal enhancement, images 58 through 83. There is also strandy edema and inflammation along the left pelvic sidewall inferiorly and also within the presacral space. Appearance is compatible with acute distal colitis or proctitis, possibly secondary to the recent radiation treatments. No pelvic defined fluid collection or abscess. Vascular/Lymphatic: Tortuosity and atherosclerosis of the aorta. No adenopathy. Reproductive: Prostate gland appears smaller. Metallic implant seeds within the prostate base noted. Prostate calcifications evident. Other: No abdominal wall hernia or abnormality. No abdominopelvic ascites. Musculoskeletal: Diffuse degenerative changes noted of the spine. No acute osseous finding. IMPRESSION: Acute distal colitis versus proctitis of the lower sigmoid and rectum within the inferior pelvis with adjacent pelvic and presacral space strandy edema/free fluid. This may be secondary to the recent  prostate radiation. No evidence of abscess, fluid collection, obstruction, or free air. Radiation implant seeds in the prostate. Prostate gland does appear smaller compared 08/07/2016. Colonic diverticulosis Aortoiliac atherosclerosis Electronically Signed   By: Jerilynn Mages.  Shick M.D.   On: 01/11/2017 12:59    Procedures Procedures (including critical care time)  Medications Ordered in ED Medications  magnesium sulfate (IV Push/IM) injection 1 g (not administered)  enoxaparin (LOVENOX) injection 40 mg (40 mg Subcutaneous Given 01/11/17 2206)  dextrose 5% in lactated ringers with KCl 20 mEq/L infusion ( Intravenous New Bag/Given 01/12/17 0813)  oxyCODONE (Oxy IR/ROXICODONE) immediate release tablet 5 mg (not administered)  ondansetron (ZOFRAN) tablet 4 mg (not administered)    Or  ondansetron (ZOFRAN) injection  4 mg (not administered)  amLODipine (NORVASC) tablet 5 mg (5 mg Oral Given by Other 01/12/17 0957)  traZODone (DESYREL) tablet 25 mg (25 mg Oral Given 01/11/17 2203)  sodium chloride 0.9 % bolus 500 mL (0 mLs Intravenous Stopped 01/11/17 1252)  morphine 4 MG/ML injection 4 mg (4 mg Intravenous Given 01/11/17 1124)  ondansetron (ZOFRAN) injection 4 mg (4 mg Intravenous Given 01/11/17 1122)  iopamidol (ISOVUE-300) 61 % injection 100 mL (100 mLs Intravenous Contrast Given 01/11/17 1212)  potassium chloride 10 mEq in 100 mL IVPB (0 mEq Intravenous Stopped 01/11/17 1551)  piperacillin-tazobactam (ZOSYN) IVPB 3.375 g (0 g Intravenous Stopped 01/11/17 1457)  metroNIDAZOLE (FLAGYL) IVPB 500 mg (0 mg Intravenous Stopped 01/11/17 1457)  sodium chloride 0.9 % bolus 500 mL (0 mLs Intravenous Stopped 01/11/17 1501)  magnesium sulfate 1-5 GM/100ML-% IVPB (1 g  New Bag/Given 01/11/17 1340)     Initial Impression / Assessment and Plan / ED Course  I have reviewed the triage vital signs and the nursing notes.  Pertinent labs & imaging results that were available during my care of the patient were reviewed by me and considered in  my medical decision making (see chart for details).     Initiated potassium replacement in emergency department. While this likely radiation-induced colitis, will cover for possible bacterial causes. Discussed with hospitalist and will admit for electrolyte replacement and rehydration.  Final Clinical Impressions(s) / ED Diagnoses   Final diagnoses:  Colitis  Hypokalemia  Dehydration  Generalized abdominal pain    New Prescriptions Current Discharge Medication List       Julianne Rice, MD 01/12/17 1047

## 2017-01-11 NOTE — ED Triage Notes (Signed)
Pt c/o chronic diarrhea and low abd pain since 12/04/16, when he was in the middle of radiation tx for prostate CA.

## 2017-01-11 NOTE — H&P (Signed)
History and Physical    Craig Jensen YJE:[id number] DOB: Feb 04, 1930 DOA: 01/11/2017  I have briefly reviewed the patient's prior medical records in [place]  PCP: Burnard Bunting, MD  Patient coming from: home  Chief Complaint: diarrhea, weakness  HPI: Craig Jensen is a 83 y.o. male with medical history significant of DM without complications, prostate cancer s/p XRT last month who presents with severe diarrhea. He tells me that he is generally pretty healthy, and does not take any medications at home, and he was recently diagnosed with prostate cancer.  He underwent 40 sessions of radiation therapy over the course of 8 weeks, and finished them about 2 and half weeks ago.  Right around treatment #25 per patient, he started experiencing loose watery stools, initially they were not that bad however as he finishes radiation treatment and more so after that his diarrhea has gotten a lot worse.  Currently he has roughly about 20 diarrheal bowel movements per day.  He has been having significantly decreased p.o. intake as he has no appetite, and feels like his diarrhea is getting worse as soon as he eats anything.  He denies any fever or chills, he denies any chest pain, has no shortness of breath.  He also has abdominal discomfort especially in the lower abdomen.  ED Course: in the ED his vitals are stable, blood work pertinent for hypokalemia and hypomagnesemia. CT scan of the abdomen and pelvis shows acute distal colitis versus proctitis of the lower sigmoid and rectum within the inferior pelvis with adjacent pelvic and presacral space strandy edema/free fluid. He was given Zosyn by EDP and TRH was asked for admission for dehydration / electrolyte abnormalities  Review of Systems: As per HPI otherwise 10 point review of systems negative.   Past Medical History:  Diagnosis Date  . Arthritis   . Cataract   . Diabetes mellitus without complication ([place])   . Hyperlipidemia   .  Inguinal hernia   . Kidney stones   . Peripheral neuropathy   . Prostate cancer ([place])   . Prostate cancer [facility])     Past Surgical History:  Procedure Laterality Date  . COLONOSCOPY    . EYE SURGERY Bilateral    cataract surgery w/ lens implant  . HERNIA REPAIR Left    inguinal hernia  . INNER EAR SURGERY Right   . KIDNEY STONE SURGERY    . PROSTATE BIOPSY    . REVERSE SHOULDER ARTHROPLASTY Right 01/11/2016   Procedure: RIGHT REVERSE SHOULDER ARTHROPLASTY;  Surgeon: Justice Britain, MD;  Location: [facility];  Service: Orthopedics;  Laterality: Right;  Marland Kitchen VASECTOMY       reports that he has never smoked. He has never used smokeless tobacco. He reports that he drinks alcohol. He reports that he does not use drugs.  No Known Allergies  Family History  Problem Relation Age of Onset  . Cancer Mother     uncertain  . Stroke Mother   . Heart disease Father   . Stroke Brother     brain aneurysm  . Heart disease Brother     Prior to Admission medications   Medication Sig Start Date End Date Taking? Authorizing Provider  diazepam (VALIUM) 2 MG tablet Take 1 tablet (2 mg total) by mouth every 6 (six) hours as needed for muscle spasms or sedation. Patient not taking: Reported on 08/14/2016 01/12/16   Shuford, Olivia Mackie, PA-C  HYDROcodone-acetaminophen (NORCO) 5-325 MG tablet Take 1-2 tablets by mouth every 4 (  four) hours as needed. Patient not taking: Reported on 08/13/2016 01/12/16   Shuford, Olivia Mackie, PA-C  ibuprofen (ADVIL,MOTRIN) 200 MG tablet Take 200 mg by mouth every 6 (six) hours as needed.    [provider]  loperamide (IMODIUM A-D) 2 MG tablet Take 2 mg by mouth 4 (four) times daily as needed for diarrhea or loose stools.    [provider]  ondansetron (ZOFRAN) 4 MG tablet Take 1 tablet (4 mg total) by mouth every 8 (eight) hours as needed for nausea or vomiting. Patient not taking: Reported on 08/14/2016 01/12/16   Jenetta Loges, PA-C   Physical Exam: Vitals:   01/11/17  1050 01/11/17 1053 01/11/17 1300  BP: (!) 183/72  (!) 173/72  Pulse: 80  79  Resp: 20  18  Temp: 98.4 F (36.9 C)    TempSrc: Oral    SpO2: 98%  97%  Weight:  98.9 kg (218 lb)   Height:  6\' 1"  (1.854 m)    Constitutional: NAD, calm, comfortable Eyes: PERRL, lids and conjunctivae normal ENMT: Mucous membranes are moist. Posterior pharynx clear of any exudate or lesions. Neck: normal, supple Respiratory: clear to auscultation bilaterally, no wheezing, no crackles. Normal respiratory effort. Cardiovascular: Regular rate and rhythm, no murmurs / rubs / gallops. Trace extremity edema. 2+ pedal pulses.  Abdomen: discomfort to palpation lower mid abdomen, no masses palpated. Bowel sounds positive.  Musculoskeletal: no clubbing / cyanosis. Normal muscle tone.  Skin: no rashes, lesions, ulcers. No induration Neurologic: CN 2-12 grossly intact. Strength 5/5 in all 4.  Psychiatric: Normal judgment and insight. Alert and oriented x 3. Normal mood.   Labs on Admission: I have personally reviewed following labs and imaging studies  CBC:  Recent Labs Lab 01/11/17 1124  WBC 7.3  NEUTROABS 4.7  HGB 11.4*  HCT 33.5*  MCV 92.8  PLT 628   Basic Metabolic Panel:  Recent Labs Lab 01/11/17 1124  NA 138  K 2.8*  CL 104  CO2 24  GLUCOSE 152*  BUN 14  CREATININE 0.67  CALCIUM 8.1*  MG 1.6*   GFR: Estimated Creatinine Clearance: 83.6 mL/min (by C-G formula based on SCr of 0.67 mg/dL). Liver Function Tests:  Recent Labs Lab 01/11/17 1124  AST 18  ALT 14*  ALKPHOS 78  BILITOT 1.1  PROT 6.0*  ALBUMIN 2.4*    Recent Labs Lab 01/11/17 1124  LIPASE 12   Urine analysis:    Component Value Date/Time   COLORURINE YELLOW 01/11/2017 1248   APPEARANCEUR CLEAR 01/11/2017 1248   LABSPEC 1.029 01/11/2017 1248   PHURINE 6.5 01/11/2017 1248   GLUCOSEU NEGATIVE 01/11/2017 1248   HGBUR NEGATIVE 01/11/2017 [place] 01/11/2017 1248   KETONESUR 40 (A) 01/11/2017  1248   PROTEINUR NEGATIVE 01/11/2017 1248   UROBILINOGEN 0.2 04/12/2008 2025   NITRITE NEGATIVE 01/11/2017 1248   LEUKOCYTESUR SMALL (A) 01/11/2017 1248    Radiological Exams on Admission: Ct Abdomen Pelvis W Contrast  Result Date: 01/11/2017 CLINICAL DATA:  Chronic lower abdominopelvic pain with diarrhea. Recently completed pelvic radiation for prostate cancer. Associated weakness and fatigue. EXAM: CT ABDOMEN AND PELVIS WITH CONTRAST TECHNIQUE: Multidetector CT imaging of the abdomen and pelvis was performed using the standard protocol following bolus administration of intravenous contrast. CONTRAST:  120mL ISOVUE-300 IOPAMIDOL (ISOVUE-300) INJECTION 61% COMPARISON:  08/07/2016 FINDINGS: Lower chest: Normal heart size. No pericardial or pleural effusion. Minor basilar scarring. No lower lobe pneumonia. Degenerative changes of the lower thoracic spine with osteophytes  on the right. Hepatobiliary: No focal liver abnormality is seen. No gallstones, gallbladder wall thickening, or biliary dilatation. Pancreas: Unremarkable. No pancreatic ductal dilatation or surrounding inflammatory changes. Spleen: Normal in size without focal abnormality. Adrenals/Urinary Tract: Normal adrenal glands for age. No renal obstruction, hydronephrosis, or obstructing ureteral calculus. Right renal parapelvic cyst measures 12 mm. Bladder demonstrates small diverticula on the right. Stomach/Bowel: Negative for bowel obstruction, significant dilatation, ileus, or free air. Appendix is not visualized. No fluid collection, abscess, or hemorrhage. Moderate volume of retained formed stool throughout the colon. Diverticulosis of the left descending colon and sigmoid. In the pelvis, the distal sigmoid and rectum demonstrate circumferential wall thickening with some mucosal enhancement, images 58 through 83. There is also strandy edema and inflammation along the left pelvic sidewall inferiorly and also within the presacral space.  Appearance is compatible with acute distal colitis or proctitis, possibly secondary to the recent radiation treatments. No pelvic defined fluid collection or abscess. Vascular/Lymphatic: Tortuosity and atherosclerosis of the aorta. No adenopathy. Reproductive: Prostate gland appears smaller. Metallic implant seeds within the prostate base noted. Prostate calcifications evident. Other: No abdominal wall hernia or abnormality. No abdominopelvic ascites. Musculoskeletal: Diffuse degenerative changes noted of the spine. No acute osseous finding. IMPRESSION: Acute distal colitis versus proctitis of the lower sigmoid and rectum within the inferior pelvis with adjacent pelvic and presacral space strandy edema/free fluid. This may be secondary to the recent prostate radiation. No evidence of abscess, fluid collection, obstruction, or free air. Radiation implant seeds in the prostate. Prostate gland does appear smaller compared 08/07/2016. Colonic diverticulosis Aortoiliac atherosclerosis Electronically Signed   By: Jerilynn Mages.  Shick M.D.   On: 01/11/2017 12:59    EKG: Independently reviewed. Sinus rhythm  Assessment/Plan Active Problems:   Malignant neoplasm of prostate (HCC)   Colitis   Hypokalemia   Abdominal pain / diarrhea -likely radiation induced as it appeared during XRT and has progressed  -C diff sent in the ED, will rule out as already ordered -for now I have a very low suspicion for infectious etiology, will not continue antibiotics, he is afebrile and his white count is normal. -Supportive treatment, IV fluids, allow regular diet  Hypokalemia / hypomagnesemia -repleted, recheck in am   Hypertension -Patient has persistent hypertension in the last couple of days, it was in the 190s at home and currently is in the 160-170s.  We will start Norvasc   DVT prophylaxis: Lovenox  Code Status: DNR  Family Communication: d/w wife at bedside Disposition Plan: admit to medsurg, home when ready Consults  called: none     Admission status: inpatient   At the time of admission, it appears that the appropriate admission status for this patient is INPATIENT. This is judged to be reasonable and necessary in order to provide the required high service intensity to ensure the patient's safety given the presenting symptoms, physical exam findings, and initial radiographic and laboratory data in the context of their chronic comorbidities. Current circumstances are significant proctitis, electrolyte imbalances, IV fluids, and it is felt to place patient at high risk for further clinical deterioration threatening life, limb, or organ. Moreover, it is my clinical judgment that the patient will require inpatient hospital care spanning beyond 2 midnights from the point of admission and that early discharge would result in unnecessary risk of decompensation and readmission or threat to life, limb or bodily function.   Marzetta Board, MD [facility] Pager [phone]  If 7PM-7AM, please contact night-coverage [email or web address] Password  [email or web address]  01/11/2017, 1:37 PM

## 2017-01-11 NOTE — Progress Notes (Signed)
Called by Dr. Lita Mains regarding Craig Jensen, 83 yo M with prostate cancer with recent XRT, here with severe diarrhea, inability to keep up with po intake and becoming weaker, hypokalemia, CT scan with colitis vs radiation proctitis. Was given Zosyn.    RN, on patient arrival, please call [phone] and let patient placement RN know of the patient's arrival and a hospitalist will be assigned to admit the patient.   Costin M. Cruzita Lederer, MD [facility] [phone]  If 7PM-7AM, please contact night-coverage [email or web address] Password [facility]  10/18/2016, 5:37 AM

## 2017-01-12 LAB — COMPREHENSIVE METABOLIC PANEL
ALT: 14 U/L — ABNORMAL LOW (ref 17–63)
AST: 17 U/L (ref 15–41)
Albumin: 2.3 g/dL — ABNORMAL LOW (ref 3.5–5.0)
Alkaline Phosphatase: 71 U/L (ref 38–126)
Anion gap: 6 (ref 5–15)
BUN: 12 mg/dL (ref 6–20)
CO2: 27 mmol/L (ref 22–32)
Calcium: 7.8 mg/dL — ABNORMAL LOW (ref 8.9–10.3)
Chloride: 105 mmol/L (ref 101–111)
Creatinine, Ser: 0.72 mg/dL (ref 0.61–1.24)
GFR calc Af Amer: >60 mL/min (ref >60)
GFR calc non Af Amer: >60 mL/min (ref >60)
Glucose, Bld: 148 mg/dL — ABNORMAL HIGH (ref 65–99)
Potassium: 3.4 mmol/L — ABNORMAL LOW (ref 3.5–5.1)
Sodium: 138 mmol/L (ref 135–145)
Total Bilirubin: 0.9 mg/dL (ref 0.3–1.2)
Total Protein: 5.8 g/dL — ABNORMAL LOW (ref 6.5–8.1)

## 2017-01-12 LAB — CBC
HCT: 32 % — ABNORMAL LOW (ref 39.0–52.0)
Hemoglobin: 10.7 g/dL — ABNORMAL LOW (ref 13.0–17.0)
MCH: 31.2 pg (ref 26.0–34.0)
MCHC: 33.4 g/dL (ref 30.0–36.0)
MCV: 93.3 fL (ref 78.0–100.0)
Platelets: 369 10*3/uL (ref 150–400)
RBC: 3.43 MIL/uL — ABNORMAL LOW (ref 4.22–5.81)
RDW: 15.4 % (ref 11.5–15.5)
WBC: 7.7 10*3/uL (ref 4.0–10.5)

## 2017-01-12 NOTE — Progress Notes (Signed)
PROGRESS NOTE    Craig Jensen  KCL:[id number] DOB: [birth year] DOA: 01/11/2017 PCP: Burnard Bunting, MD   Brief Narrative: Craig Jensen is a 83 y.o. male with medical history significant of DM without complications, prostate cancer s/p XRT last month. Recent presented with continued diarrhea for the past 6 weeks in addition to weakness. Likely secondary to colitis from radiation. Infectious causes being ruled out.   Assessment & Plan:   Active Problems:   Malignant neoplasm of prostate ([place])   Colitis   Hypokalemia   Colitis Diarrhea Likely secondary to radiation. C. difficile was negative. Seems to be improving with increased and more formed stools -GI pathogen panel -Start antidiarrheal medication once infectious disease is ruled out  Hypokalemia Hypomagnesemia Supplementation given -repeat BMP in the morning  Essential hypertension -Continue Norvasc   DVT prophylaxis: Lovenox Code Status: Full code Family Communication: None at bedside Disposition Plan: Discharge pending medical improvement   Consultants:   None  Procedures:   None  Antimicrobials:  Zosyn (5/5)  Metronidazole (5/5)   Subjective: Patient reports one large soft bowel movement this morning. He has had multiple 1-2 ounce bowel movements that are watery prior to this bigger bowel movement. No abdominal pain. No nausea or vomiting.  Objective: Vitals:   01/11/17 1458 01/11/17 1625 01/11/17 2050 01/12/17 0412  BP: (!) 168/78 (!) 179/73 (!) 150/105 (!) 147/77  Pulse: 99 77 93 71  Resp: 20 18 20 18   Temp: 98.7 F (37.1 C) 98.4 F (36.9 C) 99.5 F (37.5 C) 98.3 F (36.8 C)  TempSrc: Oral Oral Oral Oral  SpO2: 96% 99% 100% 98%  Weight:      Height:        Intake/Output Summary (Last 24 hours) at 01/12/17 1312 Last data filed at 01/12/17 0842  Gross per 24 hour  Intake              600 ml  Output              600 ml  Net                0 ml   Filed Weights   01/11/17  1053  Weight: 98.9 kg (218 lb)    Examination:  General exam: Appears calm and comfortable Respiratory system: Clear to auscultation. Respiratory effort normal. Cardiovascular system: S1 & S2 heard, RRR. No murmurs. Gastrointestinal system: Abdomen is nondistended, soft and nontender. Normal bowel sounds heard. Central nervous system: Alert and oriented. No focal neurological deficits. Extremities: 2+ edema. No calf tenderness. Skin: No cyanosis. No rashes. Psychiatry: Judgement and insight appear normal. Mood & affect appropriate.     Data Reviewed: I have personally reviewed following labs and imaging studies  CBC:  Recent Labs Lab 01/11/17 1124 01/12/17 0355  WBC 7.3 7.7  NEUTROABS 4.7  --   HGB 11.4* 10.7*  HCT 33.5* 32.0*  MCV 92.8 93.3  PLT 377 494   Basic Metabolic Panel:  Recent Labs Lab 01/11/17 1124 01/12/17 0355  NA 138 138  K 2.8* 3.4*  CL 104 105  CO2 24 27  GLUCOSE 152* 148*  BUN 14 12  CREATININE 0.67 0.72  CALCIUM 8.1* 7.8*  MG 1.6*  --    GFR: Estimated Creatinine Clearance: 83.6 mL/min (by C-G formula based on SCr of 0.72 mg/dL). Liver Function Tests:  Recent Labs Lab 01/11/17 1124 01/12/17 0355  AST 18 17  ALT 14* 14*  ALKPHOS 78 71  BILITOT 1.1 0.9  PROT 6.0* 5.8*  ALBUMIN 2.4* 2.3*    Recent Labs Lab 01/11/17 1124  LIPASE 12   No results for input(s): AMMONIA in the last 168 hours. Coagulation Profile: No results for input(s): INR, PROTIME in the last 168 hours. Cardiac Enzymes: No results for input(s): CKTOTAL, CKMB, CKMBINDEX, TROPONINI in the last 168 hours. BNP (last 3 results) No results for input(s): PROBNP in the last 8760 hours. HbA1C: No results for input(s): HGBA1C in the last 72 hours. CBG: No results for input(s): GLUCAP in the last 168 hours. Lipid Profile: No results for input(s): CHOL, HDL, LDLCALC, TRIG, CHOLHDL, LDLDIRECT in the last 72 hours. Thyroid Function Tests: No results for input(s): TSH,  T4TOTAL, FREET4, T3FREE, THYROIDAB in the last 72 hours. Anemia Panel: No results for input(s): VITAMINB12, FOLATE, FERRITIN, TIBC, IRON, RETICCTPCT in the last 72 hours. Sepsis Labs: No results for input(s): PROCALCITON, LATICACIDVEN in the last 168 hours.  Recent Results (from the past 240 hour(s))  C difficile quick scan w PCR reflex     Status: None   Collection Time: 01/11/17  5:00 PM  Result Value Ref Range Status   C Diff antigen NEGATIVE NEGATIVE Final   C Diff toxin NEGATIVE NEGATIVE Final   C Diff interpretation No C. difficile detected.  Final         Radiology Studies: Ct Abdomen Pelvis W Contrast  Result Date: 01/11/2017 CLINICAL DATA:  Chronic lower abdominopelvic pain with diarrhea. Recently completed pelvic radiation for prostate cancer. Associated weakness and fatigue. EXAM: CT ABDOMEN AND PELVIS WITH CONTRAST TECHNIQUE: Multidetector CT imaging of the abdomen and pelvis was performed using the standard protocol following bolus administration of intravenous contrast. CONTRAST:  185mL ISOVUE-300 IOPAMIDOL (ISOVUE-300) INJECTION 61% COMPARISON:  08/07/2016 FINDINGS: Lower chest: Normal heart size. No pericardial or pleural effusion. Minor basilar scarring. No lower lobe pneumonia. Degenerative changes of the lower thoracic spine with osteophytes on the right. Hepatobiliary: No focal liver abnormality is seen. No gallstones, gallbladder wall thickening, or biliary dilatation. Pancreas: Unremarkable. No pancreatic ductal dilatation or surrounding inflammatory changes. Spleen: Normal in size without focal abnormality. Adrenals/Urinary Tract: Normal adrenal glands for age. No renal obstruction, hydronephrosis, or obstructing ureteral calculus. Right renal parapelvic cyst measures 12 mm. Bladder demonstrates small diverticula on the right. Stomach/Bowel: Negative for bowel obstruction, significant dilatation, ileus, or free air. Appendix is not visualized. No fluid collection, abscess,  or hemorrhage. Moderate volume of retained formed stool throughout the colon. Diverticulosis of the left descending colon and sigmoid. In the pelvis, the distal sigmoid and rectum demonstrate circumferential wall thickening with some mucosal enhancement, images 58 through 83. There is also strandy edema and inflammation along the left pelvic sidewall inferiorly and also within the presacral space. Appearance is compatible with acute distal colitis or proctitis, possibly secondary to the recent radiation treatments. No pelvic defined fluid collection or abscess. Vascular/Lymphatic: Tortuosity and atherosclerosis of the aorta. No adenopathy. Reproductive: Prostate gland appears smaller. Metallic implant seeds within the prostate base noted. Prostate calcifications evident. Other: No abdominal wall hernia or abnormality. No abdominopelvic ascites. Musculoskeletal: Diffuse degenerative changes noted of the spine. No acute osseous finding. IMPRESSION: Acute distal colitis versus proctitis of the lower sigmoid and rectum within the inferior pelvis with adjacent pelvic and presacral space strandy edema/free fluid. This may be secondary to the recent prostate radiation. No evidence of abscess, fluid collection, obstruction, or free air. Radiation implant seeds in the prostate. Prostate gland does appear smaller compared 08/07/2016. Colonic diverticulosis Aortoiliac atherosclerosis  Electronically Signed   By: Jerilynn Mages.  Shick M.D.   On: 01/11/2017 12:59        Scheduled Meds: . amLODipine  5 mg Oral Daily  . enoxaparin (LOVENOX) injection  40 mg Subcutaneous Q24H  . magnesium sulfate  1 g Intravenous Once   Continuous Infusions: . dextrose 5% lactated ringers with KCl 20 mEq/L 100 mL/hr at 01/12/17 0813     LOS: 1 day     Cordelia Poche, MD [facility] 01/12/2017, 1:12 PM Pager: [phone]  If 7PM-7AM, please contact night-coverage [email or web address] Password [email or web address] 01/12/2017, 1:12 PM

## 2017-01-13 ENCOUNTER — Telehealth: Payer: Self-pay | Admitting: Radiation Oncology

## 2017-01-13 DIAGNOSIS — K627 Radiation proctitis: Principal | ICD-10-CM

## 2017-01-13 DIAGNOSIS — R197 Diarrhea, unspecified: Secondary | ICD-10-CM

## 2017-01-13 DIAGNOSIS — R935 Abnormal findings on diagnostic imaging of other abdominal regions, including retroperitoneum: Secondary | ICD-10-CM

## 2017-01-13 LAB — BASIC METABOLIC PANEL
Anion gap: 4 — ABNORMAL LOW (ref 5–15)
BUN: 10 mg/dL (ref 6–20)
CO2: 27 mmol/L (ref 22–32)
Calcium: 7.6 mg/dL — ABNORMAL LOW (ref 8.9–10.3)
Chloride: 107 mmol/L (ref 101–111)
Creatinine, Ser: 0.66 mg/dL (ref 0.61–1.24)
GFR calc Af Amer: >60 mL/min (ref >60)
GFR calc non Af Amer: >60 mL/min (ref >60)
Glucose, Bld: 149 mg/dL — ABNORMAL HIGH (ref 65–99)
Potassium: 3.4 mmol/L — ABNORMAL LOW (ref 3.5–5.1)
Sodium: 138 mmol/L (ref 135–145)

## 2017-01-13 LAB — GASTROINTESTINAL PANEL BY PCR, STOOL (REPLACES STOOL CULTURE)

## 2017-01-13 LAB — CBC
HCT: 27.8 % — ABNORMAL LOW (ref 39.0–52.0)
Hemoglobin: 9.3 g/dL — ABNORMAL LOW (ref 13.0–17.0)
MCH: 31.1 pg (ref 26.0–34.0)
MCHC: 33.5 g/dL (ref 30.0–36.0)
MCV: 93 fL (ref 78.0–100.0)
Platelets: 309 10*3/uL (ref 150–400)
RBC: 2.99 MIL/uL — ABNORMAL LOW (ref 4.22–5.81)
RDW: 15.3 % (ref 11.5–15.5)
WBC: 6 10*3/uL (ref 4.0–10.5)

## 2017-01-13 MED ORDER — LOPERAMIDE HCL 2 MG PO CAPS
2.0000 mg | ORAL_CAPSULE | ORAL | Status: DC | PRN
  Administered 2017-01-13: 2 mg via ORAL
  Filled 2017-01-13: qty 1

## 2017-01-13 MED ORDER — LOPERAMIDE HCL 2 MG PO CAPS
4.0000 mg | ORAL_CAPSULE | Freq: Once | ORAL | Status: AC
  Administered 2017-01-13: 4 mg via ORAL
  Filled 2017-01-13: qty 2

## 2017-01-13 MED ORDER — LOPERAMIDE HCL 2 MG PO CAPS
4.0000 mg | ORAL_CAPSULE | Freq: Four times a day (QID) | ORAL | Status: DC
  Administered 2017-01-13 – 2017-01-14 (×4): 4 mg via ORAL
  Filled 2017-01-13 (×4): qty 2

## 2017-01-13 NOTE — Telephone Encounter (Signed)
Doug, patient's son, called back to confirm he would meet Dr. Tammi Klippel in his father's hospital room during the 4 o clock hour to review all questions and future plans.

## 2017-01-13 NOTE — Evaluation (Signed)
Physical Therapy Evaluation Patient Details Name: Craig Jensen MRN: [id number] DOB: Nov 24, 1929 Today's Date: 01/13/2017   History of Present Illness  83 yo male admitted with colitis, weakness. Hx of R reverse total shoulder arthroplasty, DM, neuropathy, DM, prostate ca.   Clinical Impression  On eval, pt required Min guard assist for mobility. He walked ~100 feet with a RW. Pt presents with general weakness and impaired gait and balance. Discussed d/c plan-pt is undecided about whether he will go home or to the rehab section of [place]. Will follow and progress activity as tolerated.     Follow Up Recommendations Home health PT;Supervision/Assistance - 24 hour vs SNF (depending on progress and pt's decision)    Equipment Recommendations  None recommended by PT    Recommendations for Other Services       Precautions / Restrictions Precautions Precautions: Fall Precaution Comments: diarrhea Restrictions Weight Bearing Restrictions: No      Mobility  Bed Mobility               General bed mobility comments: sitting EOB  Transfers Overall transfer level: Needs assistance Equipment used: Rolling walker (2 wheeled) Transfers: Sit to/from Stand Sit to Stand: Min guard         General transfer comment: close guard for safety. VCs hand placement.   Ambulation/Gait Ambulation/Gait assistance: Min guard Ambulation Distance (Feet): 100 Feet Assistive device: Rolling walker (2 wheeled) Gait Pattern/deviations: Step-through pattern;Decreased stride length     General Gait Details: close guard for safety.   Stairs            Wheelchair Mobility    Modified Rankin (Stroke Patients Only)       Balance Overall balance assessment: Needs assistance           Standing balance-Leahy Scale: Poor Standing balance comment: requiring RW currently                             Pertinent Vitals/Pain Pain Assessment: No/denies pain     Home Living Family/patient expects to be discharged to:: Private residence Living Arrangements: Spouse/significant other Available Help at Discharge: Family Type of Home: Apartment ([place]) Home Access: Level entry     Home Layout: One level Home Equipment: Environmental consultant - 2 wheels;Cane - single point      Prior Function Level of Independence: Independent               Hand Dominance        Extremity/Trunk Assessment   Upper Extremity Assessment Upper Extremity Assessment: Generalized weakness    Lower Extremity Assessment Lower Extremity Assessment: Generalized weakness    Cervical / Trunk Assessment Cervical / Trunk Assessment: Kyphotic  Communication   Communication: No difficulties  Cognition Arousal/Alertness: Awake/alert Behavior During Therapy: WFL for tasks assessed/performed Overall Cognitive Status: Within Functional Limits for tasks assessed                                        General Comments      Exercises     Assessment/Plan    PT Assessment Patient needs continued PT services  PT Problem List Decreased strength;Decreased mobility;Decreased activity tolerance;Decreased balance;Decreased knowledge of use of DME       PT Treatment Interventions DME instruction;Gait training;Therapeutic activities;Therapeutic exercise;Patient/family education;Functional mobility training;Balance training    PT Goals (Current goals can  be found in the Care Plan section)  Acute Rehab PT Goals Patient Stated Goal: regain PLOF PT Goal Formulation: With patient Time For Goal Achievement: 01/27/17 Potential to Achieve Goals: Good    Frequency Min 3X/week   Barriers to discharge        Co-evaluation               AM-PAC PT "6 Clicks" Daily Activity  Outcome Measure Difficulty turning over in bed (including adjusting bedclothes, sheets and blankets)?: None Difficulty moving from lying on back to sitting on the side of the  bed? : None Difficulty sitting down on and standing up from a chair with arms (e.g., wheelchair, bedside commode, etc,.)?: A Little Help needed moving to and from a bed to chair (including a wheelchair)?: A Little Help needed walking in hospital room?: A Little Help needed climbing 3-5 steps with a railing? : A Little 6 Click Score: 20    End of Session   Activity Tolerance: Patient tolerated treatment well Patient left: in bed;with call bell/phone within reach (sitting EOB)   PT Visit Diagnosis: Muscle weakness (generalized) (M62.81);Difficulty in walking, not elsewhere classified (R26.2)    Time: 3646-8032 PT Time Calculation (min) (ACUTE ONLY): 10 min   Charges:   PT Evaluation $PT Eval Low Complexity: 1 Procedure     PT G Codes:          Weston Anna, MPT Pager: [phone]

## 2017-01-13 NOTE — Progress Notes (Signed)
Nutrition Brief Note  Patient identified on the Malnutrition Screening Tool (MST) Report  Weight is stable. Pt eating very well, 100% x multiple meals since admission.  Wt Readings from Last 15 Encounters:  01/11/17 218 lb (98.9 kg)  12/13/16 228 lb (103.4 kg)  12/09/16 226 lb 9.6 oz (102.8 kg)  11/07/16 228 lb 6.4 oz (103.6 kg)  11/06/16 229 lb (103.9 kg)  08/14/16 225 lb 3.2 oz (102.2 kg)  01/11/16 220 lb (99.8 kg)  01/05/16 224 lb 1.6 oz (101.7 kg)  10/29/15 220 lb (99.8 kg)  12/28/12 226 lb (102.5 kg)    Body mass index is 28.76 kg/m. Patient meets criteria for overweight based on current BMI.   Current diet order is regular, patient is consuming approximately 100% of meals at this time. Labs and medications reviewed.   No nutrition interventions warranted at this time. If nutrition issues arise, please consult RD.   Clayton Bibles, MS, RD, LDN Pager: [phone] After Hours Pager: [phone]

## 2017-01-13 NOTE — Telephone Encounter (Signed)
Received message from patient's son, Marden Noble, requesting a return call. Phoned Doug back. He questions the amount of Gy his father received, time frame symptoms are expected to persist and management moving forward. Also, he questions percentages of patient's that experience cystitis and proctitis following radiation and the percent this will progress to become chronic. Reports all questions asked of patient's son to Dr. Tammi Klippel and Shona Simpson, PA-C. Dr. Tammi Klippel would like to meet with both the patient and his son at the four oclock hour today in the patient's room. Phoned Doug back to explain this. No answer. Left message requesting return call to confirm.

## 2017-01-13 NOTE — Consult Note (Signed)
Referring Provider: [facility]  Primary Care Physician:  Burnard Bunting, MD Primary Gastroenterologist:  Scarlette Shorts, MD  Reason for Consultation:  diarrhea  ASSESSMENT AND PLAN:   83 yo male admitted with six week history of severe diarrhea which started near end of radiation treatment for prostate cancer. Having up to 20 liquid, non-bloody BMs a day. Volume scant most of the time and associated with tenesmus and urgency.  CTscan suggests distal sigmoid colitis / proctitis.  C-diff and GI pathogen panel negative. 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  -2 tablespoons of metamucil daily in an attempt to absorb fluid from stool -Imodium 4mg  QID. Will need to decrease frequency as diarrhea improves.  -Unfortunately his will take time to resolve.   2. Probable protein calorie malnutrition / hypoalbuminemia / weight loss of 10 pounds since diarrhea started. He has BLE edema, possibly from low albumin.   HPI: Craig Jensen is a 83 y.o. male. He had a colonoscopy with removal of hyperplastic polyps in 2008. Patient completed radiation for prostate cancer 3 weeks ago. After about the 25th treatment patient developed non-bloody diarrhea. He had been having up to 20 times a day with associated urgency and tenesmus. Prior to [facility] patient has lower abdominal discomfort, feels better after defecation. He has such urgency that sometime making it to the bathroom in impossible. He also has episodes of fecal seepage. Prior to six weeks ago patient never had problems with diarrhea. He has lost 10 pounds over last 6 weeks. He eats but "everything runs right through me". No vomiting or fevers. Stool studies negative. Patient tried imodium at home. He maxed out on 16 mg /day without any improvement. For unclear reasons his stools have now started to form.    Past Medical History:  Diagnosis Date  . Arthritis   . Cataract   . Diabetes mellitus without complication ([place])   . Hyperlipidemia   .  Inguinal hernia   . Kidney stones   . Peripheral neuropathy   . Prostate cancer ([place])   . Prostate cancer [facility])     Past Surgical History:  Procedure Laterality Date  . COLONOSCOPY    . EYE SURGERY Bilateral    cataract surgery w/ lens implant  . HERNIA REPAIR Left    inguinal hernia  . INNER EAR SURGERY Right   . KIDNEY STONE SURGERY    . PROSTATE BIOPSY    . REVERSE SHOULDER ARTHROPLASTY Right 01/11/2016   Procedure: RIGHT REVERSE SHOULDER ARTHROPLASTY;  Surgeon: Justice Britain, MD;  Location: [facility];  Service: Orthopedics;  Laterality: Right;  Marland Kitchen VASECTOMY      Prior to Admission medications   Medication Sig Start Date End Date Taking? Authorizing Provider  ibuprofen (ADVIL,MOTRIN) 200 MG tablet Take 200 mg by mouth every 6 (six) hours as needed.   Yes [provider]  loperamide (IMODIUM A-D) 2 MG tablet Take 2 mg by mouth 4 (four) times daily as needed for diarrhea or loose stools.   Yes [provider]  diazepam (VALIUM) 2 MG tablet Take 1 tablet (2 mg total) by mouth every 6 (six) hours as needed for muscle spasms or sedation. Patient not taking: Reported on 01/11/2017 01/12/16   Shuford, Olivia Mackie, PA-C  HYDROcodone-acetaminophen (NORCO) 5-325 MG tablet Take 1-2 tablets by mouth every 4 (four) hours as needed. Patient not taking: Reported on 01/11/2017 01/12/16   Shuford, Olivia Mackie, PA-C  ondansetron (ZOFRAN) 4 MG tablet Take 1 tablet (4 mg total) by mouth every 8 (  eight) hours as needed for nausea or vomiting. Patient not taking: Reported on 08/14/2016 01/12/16   Shuford, Olivia Mackie, PA-C    Current Facility-Administered Medications  Medication Dose Route Frequency Provider Last Rate Last Dose  . amLODipine (NORVASC) tablet 5 mg  5 mg Oral Daily Caren Griffins, MD   5 mg at 01/13/17 0859  . dextrose 5% in lactated ringers with KCl 20 mEq/L infusion   Intravenous Continuous Caren Griffins, MD 100 mL/hr at 01/12/17 2030    . enoxaparin (LOVENOX) injection 40 mg  40 mg  Subcutaneous Q24H Caren Griffins, MD   40 mg at 01/12/17 2145  . loperamide (IMODIUM) capsule 2 mg  2 mg Oral PRN Mariel Aloe, MD   2 mg at 01/13/17 1225  . magnesium sulfate (IV Push/IM) injection 1 g  1 g Intravenous Once Julianne Rice, MD      . ondansetron [facility]) tablet 4 mg  4 mg Oral Q6H PRN Caren Griffins, MD       Or  . ondansetron (ZOFRAN) injection 4 mg  4 mg Intravenous Q6H PRN Caren Griffins, MD      . oxyCODONE (Oxy IR/ROXICODONE) immediate release tablet 5 mg  5 mg Oral Q4H PRN Caren Griffins, MD      . traZODone (DESYREL) tablet 25 mg  25 mg Oral QHS PRN Caren Griffins, MD   25 mg at 01/12/17 2144    Allergies as of 01/11/2017  . (No Known Allergies)    Family History  Problem Relation Age of Onset  . Cancer Mother     uncertain  . Stroke Mother   . Heart disease Father   . Stroke Brother     brain aneurysm  . Heart disease Brother     Social History   Social History  . Marital status: Married    Spouse name: N/A  . Number of children: N/A  . Years of education: N/A   Occupational History  . Not on file.   Social History Main Topics  . Smoking status: Never Smoker  . Smokeless tobacco: Never Used  . Alcohol use Yes     Comment: very rare  . Drug use: No  . Sexual activity: Not Currently   Other Topics Concern  . Not on file   Social History Narrative  . No narrative on file    Review of Systems: All systems reviewed and negative except where noted in HPI.  Physical Exam: Vital signs in last 24 hours: Temp:  [98.2 F (36.8 C)-98.4 F (36.9 C)] 98.4 F (36.9 C) (05/07 0540) Pulse Rate:  [73-81] 79 (05/07 0540) Resp:  [18-20] 20 (05/07 0540) BP: (144-151)/(60-70) 151/70 (05/07 0540) SpO2:  [97 %] 97 % (05/07 0540) Last BM Date: 01/13/17 General:   Alert, well-developed,  pleasant and cooperative white male in NAD Eyes:  Sclera clear, no icterus.   Conjunctiva pink. Ears:  Normal auditory acuity. Nose:  No  deformity, discharge,  or lesions. Mouth:  No deformity or lesions.  Mucous membranes moist. Neck:  Supple; no masses Lungs:  Clear throughout to auscultation.   No wheezes, crackles, or rhonchi.  Heart:  Regular rate and rhythm; no murmurs,1-2+ BLE edema.  Abdomen:  Soft,nontender, BS active,no palp mass or hsm.   Rectal:  Deferred  Msk:  Symmetrical without gross deformities. . Neurologic:  Alert and  oriented x4;  grossly normal neurologically. Skin:  Intact without significant lesions or rashes.. Psych:  Alert  and cooperative. Normal mood and affect.  Intake/Output from previous day: 05/06 0701 - 05/07 0700 In: 2090 [P.O.:1320; I.V.:770] Out: 750 [Urine:750] Intake/Output this shift: Total I/O In: 120 [P.O.:120] Out: -   Lab Results:  Recent Labs  01/11/17 1124 01/12/17 0355 01/13/17 0405  WBC 7.3 7.7 6.0  HGB 11.4* 10.7* 9.3*  HCT 33.5* 32.0* 27.8*  PLT 377 369 309   BMET  Recent Labs  01/11/17 1124 01/12/17 0355 01/13/17 0405  NA 138 138 138  K 2.8* 3.4* 3.4*  CL 104 105 107  CO2 24 27 27   GLUCOSE 152* 148* 149*  BUN 14 12 10   CREATININE 0.67 0.72 0.66  CALCIUM 8.1* 7.8* 7.6*   LFT  Recent Labs  01/12/17 0355  PROT 5.8*  ALBUMIN 2.3*  AST 17  ALT 14*  ALKPHOS 71  BILITOT 0.9   PT/INR No results for input(s): LABPROT, INR in the last 72 hours. Hepatitis Panel No results for input(s): HEPBSAG, HCVAB, HEPAIGM, HEPBIGM in the last 72 hours.   Studies/Results: No results found.  Tye Savoy, NP-C @  01/13/2017, 1:07 PM Pager number [phone]  GI ATTENDING  History, laboratories, x-rays reviewed. Patient personally seen and examined. Wife in room. Agree with comprehensive consultation note as outlined above. Patient has had significant problems with diarrhea, tenesmus, and occasional incontinence. All felt to be secondary to radiation therapy based on history and x-ray. No bleeding. Has been rehydrated. Been given Imodium on a regular  schedule. Doing better. Infectious etiologies ruled out. Recommended 2 tablespoons of Metamucil daily and continued schedule dosages of Imodium. Back off for constipation. Would expect symptoms to improve with time as radiation effect lessens. Please call for worsens or problems. Will sign off.  Docia Chuck. Geri Seminole., M.D. [facility] Division of Gastroenterology

## 2017-01-13 NOTE — Progress Notes (Signed)
PROGRESS NOTE    DANTRELL SCHERTZER  ZOX:[id number] DOB: July 11, 1930 DOA: 01/11/2017 PCP: Burnard Bunting, MD   Brief Narrative: Craig Jensen is a 83 y.o. male with medical history significant of DM without complications, prostate cancer s/p XRT last month. Recent presented with continued diarrhea for the past 6 weeks in addition to weakness. Likely secondary to colitis from radiation. Infectious causes being ruled out.   Assessment & Plan:   Active Problems:   Malignant neoplasm of prostate ([place])   Colitis   Hypokalemia   Colitis Diarrhea Likely secondary to radiation. C. difficile was negative. Seems to be improving with increased and more formed stools. Still frequent stools. Likely will improve over time. -GI pathogen panel -Start Imodium -GI consult for additional recommendations and outpatient follow-up  Hypokalemia Hypomagnesemia Supplementation given -repeat BMP in the morning  Essential hypertension -Continue Norvasc   DVT prophylaxis: Lovenox Code Status: Full code Family Communication: None at bedside Disposition Plan: Discharge pending medical improvement   Consultants:   None  Procedures:   None  Antimicrobials:  Zosyn (5/5)  Metronidazole (5/5)   Subjective: Patient is still having loose stools. Afebrile. GI pathogen panel negative.  Objective: Vitals:   01/12/17 0412 01/12/17 1415 01/12/17 2115 01/13/17 0540  BP: (!) 147/77 (!) 147/60 (!) 144/62 (!) 151/70  Pulse: 71 73 81 79  Resp: 18 18 20 20   Temp: 98.3 F (36.8 C) 98.2 F (36.8 C) 98.3 F (36.8 C) 98.4 F (36.9 C)  TempSrc: Oral Oral Oral Oral  SpO2: 98% 97% 97% 97%  Weight:      Height:        Intake/Output Summary (Last 24 hours) at 01/13/17 1155 Last data filed at 01/13/17 0135  Gross per 24 hour  Intake             1610 ml  Output              750 ml  Net              860 ml   Filed Weights   01/11/17 1053  Weight: 98.9 kg (218 lb)     Examination:  General exam: Appears calm and comfortable Respiratory system: Clear to auscultation. Respiratory effort normal. Cardiovascular system: S1 & S2 heard, RRR. No murmurs. Gastrointestinal system: Abdomen is nondistended, soft and nontender. Normal bowel sounds heard. Central nervous system: Alert and oriented. No focal neurological deficits. Extremities: 2+ edema. No calf tenderness. Skin: No cyanosis. No rashes. Psychiatry: Judgement and insight appear normal. Mood & affect appropriate.     Data Reviewed: I have personally reviewed following labs and imaging studies  CBC:  Recent Labs Lab 01/11/17 1124 01/12/17 0355 01/13/17 0405  WBC 7.3 7.7 6.0  NEUTROABS 4.7  --   --   HGB 11.4* 10.7* 9.3*  HCT 33.5* 32.0* 27.8*  MCV 92.8 93.3 93.0  PLT 377 369 811   Basic Metabolic Panel:  Recent Labs Lab 01/11/17 1124 01/12/17 0355 01/13/17 0405  NA 138 138 138  K 2.8* 3.4* 3.4*  CL 104 105 107  CO2 24 27 27   GLUCOSE 152* 148* 149*  BUN 14 12 10   CREATININE 0.67 0.72 0.66  CALCIUM 8.1* 7.8* 7.6*  MG 1.6*  --   --    GFR: Estimated Creatinine Clearance: 83.6 mL/min (by C-G formula based on SCr of 0.66 mg/dL). Liver Function Tests:  Recent Labs Lab 01/11/17 1124 01/12/17 0355  AST 18 17  ALT 14* 14*  ALKPHOS  78 71  BILITOT 1.1 0.9  PROT 6.0* 5.8*  ALBUMIN 2.4* 2.3*    Recent Labs Lab 01/11/17 1124  LIPASE 12   No results for input(s): AMMONIA in the last 168 hours. Coagulation Profile: No results for input(s): INR, PROTIME in the last 168 hours. Cardiac Enzymes: No results for input(s): CKTOTAL, CKMB, CKMBINDEX, TROPONINI in the last 168 hours. BNP (last 3 results) No results for input(s): PROBNP in the last 8760 hours. HbA1C: No results for input(s): HGBA1C in the last 72 hours. CBG: No results for input(s): GLUCAP in the last 168 hours. Lipid Profile: No results for input(s): CHOL, HDL, LDLCALC, TRIG, CHOLHDL, LDLDIRECT in the last  72 hours. Thyroid Function Tests: No results for input(s): TSH, T4TOTAL, FREET4, T3FREE, THYROIDAB in the last 72 hours. Anemia Panel: No results for input(s): VITAMINB12, FOLATE, FERRITIN, TIBC, IRON, RETICCTPCT in the last 72 hours. Sepsis Labs: No results for input(s): PROCALCITON, LATICACIDVEN in the last 168 hours.  Recent Results (from the past 240 hour(s))  Gastrointestinal Panel by PCR , Stool     Status: None   Collection Time: 01/11/17  5:00 AM  Result Value Ref Range Status   Campylobacter species NOT DETECTED NOT DETECTED Final   Plesimonas shigelloides NOT DETECTED NOT DETECTED Final   Salmonella species NOT DETECTED NOT DETECTED Final   Yersinia enterocolitica NOT DETECTED NOT DETECTED Final   Vibrio species NOT DETECTED NOT DETECTED Final   Vibrio cholerae NOT DETECTED NOT DETECTED Final   Enteroaggregative E coli (EAEC) NOT DETECTED NOT DETECTED Final   Enteropathogenic E coli (EPEC) NOT DETECTED NOT DETECTED Final   Enterotoxigenic E coli (ETEC) NOT DETECTED NOT DETECTED Final   Shiga like toxin producing E coli (STEC) NOT DETECTED NOT DETECTED Final   Shigella/Enteroinvasive E coli (EIEC) NOT DETECTED NOT DETECTED Final   Cryptosporidium NOT DETECTED NOT DETECTED Final   Cyclospora cayetanensis NOT DETECTED NOT DETECTED Final   Entamoeba histolytica NOT DETECTED NOT DETECTED Final   Giardia lamblia NOT DETECTED NOT DETECTED Final   Adenovirus F40/41 NOT DETECTED NOT DETECTED Final   Astrovirus NOT DETECTED NOT DETECTED Final   Norovirus GI/GII NOT DETECTED NOT DETECTED Final   Rotavirus A NOT DETECTED NOT DETECTED Final   Sapovirus (I, II, IV, and V) NOT DETECTED NOT DETECTED Final  C difficile quick scan w PCR reflex     Status: None   Collection Time: 01/11/17  5:00 PM  Result Value Ref Range Status   C Diff antigen NEGATIVE NEGATIVE Final   C Diff toxin NEGATIVE NEGATIVE Final   C Diff interpretation No C. difficile detected.  Final         Radiology  Studies: Ct Abdomen Pelvis W Contrast  Result Date: 01/11/2017 CLINICAL DATA:  Chronic lower abdominopelvic pain with diarrhea. Recently completed pelvic radiation for prostate cancer. Associated weakness and fatigue. EXAM: CT ABDOMEN AND PELVIS WITH CONTRAST TECHNIQUE: Multidetector CT imaging of the abdomen and pelvis was performed using the standard protocol following bolus administration of intravenous contrast. CONTRAST:  155mL ISOVUE-300 IOPAMIDOL (ISOVUE-300) INJECTION 61% COMPARISON:  08/07/2016 FINDINGS: Lower chest: Normal heart size. No pericardial or pleural effusion. Minor basilar scarring. No lower lobe pneumonia. Degenerative changes of the lower thoracic spine with osteophytes on the right. Hepatobiliary: No focal liver abnormality is seen. No gallstones, gallbladder wall thickening, or biliary dilatation. Pancreas: Unremarkable. No pancreatic ductal dilatation or surrounding inflammatory changes. Spleen: Normal in size without focal abnormality. Adrenals/Urinary Tract: Normal adrenal glands for age. No  renal obstruction, hydronephrosis, or obstructing ureteral calculus. Right renal parapelvic cyst measures 12 mm. Bladder demonstrates small diverticula on the right. Stomach/Bowel: Negative for bowel obstruction, significant dilatation, ileus, or free air. Appendix is not visualized. No fluid collection, abscess, or hemorrhage. Moderate volume of retained formed stool throughout the colon. Diverticulosis of the left descending colon and sigmoid. In the pelvis, the distal sigmoid and rectum demonstrate circumferential wall thickening with some mucosal enhancement, images 58 through 83. There is also strandy edema and inflammation along the left pelvic sidewall inferiorly and also within the presacral space. Appearance is compatible with acute distal colitis or proctitis, possibly secondary to the recent radiation treatments. No pelvic defined fluid collection or abscess. Vascular/Lymphatic:  Tortuosity and atherosclerosis of the aorta. No adenopathy. Reproductive: Prostate gland appears smaller. Metallic implant seeds within the prostate base noted. Prostate calcifications evident. Other: No abdominal wall hernia or abnormality. No abdominopelvic ascites. Musculoskeletal: Diffuse degenerative changes noted of the spine. No acute osseous finding. IMPRESSION: Acute distal colitis versus proctitis of the lower sigmoid and rectum within the inferior pelvis with adjacent pelvic and presacral space strandy edema/free fluid. This may be secondary to the recent prostate radiation. No evidence of abscess, fluid collection, obstruction, or free air. Radiation implant seeds in the prostate. Prostate gland does appear smaller compared 08/07/2016. Colonic diverticulosis Aortoiliac atherosclerosis Electronically Signed   By: Jerilynn Mages.  Shick M.D.   On: 01/11/2017 12:59        Scheduled Meds: . amLODipine  5 mg Oral Daily  . enoxaparin (LOVENOX) injection  40 mg Subcutaneous Q24H  . magnesium sulfate  1 g Intravenous Once   Continuous Infusions: . dextrose 5% lactated ringers with KCl 20 mEq/L 100 mL/hr at 01/12/17 2030     LOS: 2 days     Cordelia Poche, MD [facility] 01/13/2017, 11:55 AM Pager: [phone]  If 7PM-7AM, please contact night-coverage [email or web address] Password [email or web address] 01/13/2017, 11:55 AM

## 2017-01-14 DIAGNOSIS — F411 Generalized anxiety disorder: Secondary | ICD-10-CM | POA: Diagnosis present

## 2017-01-14 DIAGNOSIS — I10 Essential (primary) hypertension: Secondary | ICD-10-CM | POA: Diagnosis present

## 2017-01-14 DIAGNOSIS — E039 Hypothyroidism, unspecified: Secondary | ICD-10-CM | POA: Diagnosis present

## 2017-01-14 MED ORDER — AMLODIPINE BESYLATE 5 MG PO TABS: 5.0000 mg | ORAL_TABLET | Freq: Every day | ORAL | Status: DC

## 2017-01-14 NOTE — Discharge Instructions (Signed)
Craig Jensen,  Your admitted because of your profuse recurrent diarrhea. This is secondary to radiation therapy. Her symptoms have improved with his great. Please continue to take the Imodium and Metamucil. Please keep her well-hydrated.

## 2017-01-14 NOTE — Progress Notes (Signed)
LCSW following for discharge planning/disposition:  Pt will DC to [facility]. Pt lives at the independent living there but has been accepted to SNF bed at [facility].  Patient will transport by wife.  Family notified regarding discharge - Wife Constance Holster All information sent to facility by [place] Report number for RN:  [phone]  No other needs at this time   Plan: DC to [facility].   Sharren Bridge, MSW, LCSW Clinical Social Work 01/14/2017 [phone]

## 2017-01-14 NOTE — Clinical Social Work Placement (Addendum)
   CLINICAL SOCIAL WORK PLACEMENT  NOTE  Date:  01/14/2017  Patient Details  Name: Craig Jensen MRN: [id number] Date of Birth: [birth year]  Clinical Social Work is seeking post-discharge placement for this patient at the [place] level of care (*CSW will initial, date and re-position this form in  chart as items are completed):  Yes   Patient/family provided with [facility] Work Department's list of facilities offering this level of care within the geographic area requested by the patient (or if unable, by the patient's family).  Yes   Patient/family informed of their freedom to choose among providers that offer the needed level of care, that participate in Medicare, Medicaid or managed care program needed by the patient, have an available bed and are willing to accept the patient.  Yes   Patient/family informed of [facility]'s ownership interest in [facility], as well as of the fact that they are under no obligation to receive care at these facilities.  PASRR submitted to EDS on       PASRR number received on 01/14/17     Existing PASRR number confirmed on       FL2 transmitted to all facilities in geographic area requested by pt/family on       FL2 transmitted to all facilities within larger geographic area on       Patient informed that his/her managed care company has contracts with or will negotiate with certain facilities, including the following:            Patient/family informed of bed offers received.  Patient chooses bed at [facility]     Physician recommends and patient chooses bed at [facility] at [place]    Patient to be transferred to [facility] at [place] on 01/14/17.  Patient to be transferred to facility by     Wife.  Patient family notified on 01/14/17 of transfer.  Name of family member notified:  Constance Holster, wife     PHYSICIAN       Additional Comment:     _______________________________________________ Nila Nephew, LCSW 01/14/2017, 10:39 AM

## 2017-01-14 NOTE — Discharge Summary (Signed)
Physician Discharge Summary  Craig Jensen RKY:[id number] DOB: 03-May-1930 DOA: 01/11/2017  PCP: Burnard Bunting, MD  Admit date: 01/11/2017 Discharge date: 01/14/2017  Admitted From: ILF Disposition: SNF  Recommendations for Outpatient Follow-up:  1. Follow up with PCP in 1 week 2. Follow up with GI as needed 3. Follow up with Radiation oncology 4. Please obtain repeat metabolic panel/magnesium and replete as needed  Home Health: SNF Equipment/Devices: None  Discharge Condition: Stable CODE STATUS: Full code Diet recommendation: Heart healthy   Brief/Interim Summary:  Admission HPI written by Caren Griffins, MD  Chief Complaint: diarrhea, weakness  HPI: Craig Jensen is a 83 y.o. male with medical history significant of DM without complications, prostate cancer s/p XRT last month who presents with severe diarrhea. He tells me that he is generally pretty healthy, and does not take any medications at home, and he was recently diagnosed with prostate cancer.  He underwent 40 sessions of radiation therapy over the course of 8 weeks, and finished them about 2 and half weeks ago.  Right around treatment #25 per patient, he started experiencing loose watery stools, initially they were not that bad however as he finishes radiation treatment and more so after that his diarrhea has gotten a lot worse.  Currently he has roughly about 20 diarrheal bowel movements per day.  He has been having significantly decreased p.o. intake as he has no appetite, and feels like his diarrhea is getting worse as soon as he eats anything.  He denies any fever or chills, he denies any chest pain, has no shortness of breath.  He also has abdominal discomfort especially in the lower abdomen.  ED Course: in the ED his vitals are stable, blood work pertinent for hypokalemia and hypomagnesemia. CT scan of the abdomen and pelvis shows acute distal colitis versus proctitis of the lower sigmoid and rectum within the  inferior pelvis with adjacent pelvic and presacral space strandy edema/free fluid. He was given Zosyn by EDP and TRH was asked for admission for dehydration / electrolyte abnormalities    Hospital course:  Colitis/Proctitis Secondary to radiation. Infectious cause ruled out with C. Difficile panel and GI pathogen panel. Stools started becoming more formed and less frequent during hospitalization. Imodium started with further improvement. Gastroenterology consulted and agreed with management, adding metamucil. Follow-up with GI as needed. Decrease imodium for constipation.  Hypokalemia Hypomagnesemia Supplementation given.  Essential hypertension Started on Norvasc  Discharge Diagnoses:  Active Problems:   Malignant neoplasm of prostate ([place])   Colitis   Hypokalemia    Discharge Instructions  Discharge Instructions    Call MD for:  extreme fatigue    Complete by:  As directed    Call MD for:  persistant dizziness or light-headedness    Complete by:  As directed    Call MD for:  persistant nausea and vomiting    Complete by:  As directed    Call MD for:  severe uncontrolled pain    Complete by:  As directed    Call MD for:  temperature >100.4    Complete by:  As directed    Diet - low sodium heart healthy    Complete by:  As directed      Allergies as of 01/14/2017   No Known Allergies     Medication List    STOP taking these medications   diazepam 2 MG tablet Commonly known as:  VALIUM   HYDROcodone-acetaminophen 5-325 MG tablet Commonly known as:  NORCO  ibuprofen 200 MG tablet Commonly known as:  ADVIL,MOTRIN   ondansetron 4 MG tablet Commonly known as:  ZOFRAN     TAKE these medications   amLODipine 5 MG tablet Commonly known as:  NORVASC Take 1 tablet (5 mg total) by mouth daily. Start taking on:  01/15/2017   loperamide 2 MG tablet Commonly known as:  IMODIUM A-D Take 2 mg by mouth 4 (four) times daily as needed for diarrhea or loose stools.        Contact information for follow-up providers    Burnard Bunting, MD. Schedule an appointment as soon as possible for a visit in 1 week(s).   Specialty:  Internal Medicine Contact information: [address] [place] [address] [phone]        Irene Shipper, MD Follow up.   Specialty:  Gastroenterology Why:  As needed Contact information: [address] [address] [phone]            Contact information for after-discharge care    Destination    [facility] Follow up.   Specialty:  [place] information: [place] [place] [phone]                 No Known Allergies  Consultations:  Gastroenterology   Procedures/Studies: Ct Abdomen Pelvis W Contrast  Result Date: 01/11/2017 CLINICAL DATA:  Chronic lower abdominopelvic pain with diarrhea. Recently completed pelvic radiation for prostate cancer. Associated weakness and fatigue. EXAM: CT ABDOMEN AND PELVIS WITH CONTRAST TECHNIQUE: Multidetector CT imaging of the abdomen and pelvis was performed using the standard protocol following bolus administration of intravenous contrast. CONTRAST:  117mL ISOVUE-300 IOPAMIDOL (ISOVUE-300) INJECTION 61% COMPARISON:  08/07/2016 FINDINGS: Lower chest: Normal heart size. No pericardial or pleural effusion. Minor basilar scarring. No lower lobe pneumonia. Degenerative changes of the lower thoracic spine with osteophytes on the right. Hepatobiliary: No focal liver abnormality is seen. No gallstones, gallbladder wall thickening, or biliary dilatation. Pancreas: Unremarkable. No pancreatic ductal dilatation or surrounding inflammatory changes. Spleen: Normal in size without focal abnormality. Adrenals/Urinary Tract: Normal adrenal glands for age. No renal obstruction, hydronephrosis, or obstructing ureteral calculus. Right renal parapelvic cyst measures 12 mm. Bladder  demonstrates small diverticula on the right. Stomach/Bowel: Negative for bowel obstruction, significant dilatation, ileus, or free air. Appendix is not visualized. No fluid collection, abscess, or hemorrhage. Moderate volume of retained formed stool throughout the colon. Diverticulosis of the left descending colon and sigmoid. In the pelvis, the distal sigmoid and rectum demonstrate circumferential wall thickening with some mucosal enhancement, images 58 through 83. There is also strandy edema and inflammation along the left pelvic sidewall inferiorly and also within the presacral space. Appearance is compatible with acute distal colitis or proctitis, possibly secondary to the recent radiation treatments. No pelvic defined fluid collection or abscess. Vascular/Lymphatic: Tortuosity and atherosclerosis of the aorta. No adenopathy. Reproductive: Prostate gland appears smaller. Metallic implant seeds within the prostate base noted. Prostate calcifications evident. Other: No abdominal wall hernia or abnormality. No abdominopelvic ascites. Musculoskeletal: Diffuse degenerative changes noted of the spine. No acute osseous finding. IMPRESSION: Acute distal colitis versus proctitis of the lower sigmoid and rectum within the inferior pelvis with adjacent pelvic and presacral space strandy edema/free fluid. This may be secondary to the recent prostate radiation. No evidence of abscess, fluid collection, obstruction, or free air. Radiation implant seeds in the prostate. Prostate gland does appear smaller compared 08/07/2016. Colonic diverticulosis Aortoiliac atherosclerosis  Electronically Signed   By: Jerilynn Mages.  Shick M.D.   On: 01/11/2017 12:59      Subjective: Patient reports few stools overnight. He reports getting sleep overnight as well. No abdominal pain.  Discharge Exam: Vitals:   01/13/17 2202 01/14/17 0525  BP: (!) 150/79 (!) 145/62  Pulse: 74 72  Resp: 18 18  Temp: 99.1 F (37.3 C) 98.8 F (37.1 C)    Vitals:   01/13/17 0540 01/13/17 1520 01/13/17 2202 01/14/17 0525  BP: (!) 151/70 (!) 145/70 (!) 150/79 (!) 145/62  Pulse: 79 72 74 72  Resp: 20 18 18 18   Temp: 98.4 F (36.9 C) 98.5 F (36.9 C) 99.1 F (37.3 C) 98.8 F (37.1 C)  TempSrc: Oral Oral Oral Oral  SpO2: 97% 98% 98% 98%  Weight:      Height:        General exam: Appears calm and comfortable Respiratory system: Clear to auscultation. Respiratory effort normal. Cardiovascular system: S1 & S2 heard, RRR. No murmurs. Gastrointestinal system: Abdomen is nondistended, soft and nontender. Normal bowel sounds heard. Central nervous system: Alert and oriented. No focal neurological deficits. Extremities: 2+ edema. No calf tenderness. Skin: No cyanosis. No rashes. Psychiatry: Judgement and insight appear normal. Mood & affect appropriate.   The results of significant diagnostics from this hospitalization (including imaging, microbiology, ancillary and laboratory) are listed below for reference.     Microbiology: Recent Results (from the past 240 hour(s))  Gastrointestinal Panel by PCR , Stool     Status: None   Collection Time: 01/11/17  5:00 AM  Result Value Ref Range Status   Campylobacter species NOT DETECTED NOT DETECTED Final   Plesimonas shigelloides NOT DETECTED NOT DETECTED Final   Salmonella species NOT DETECTED NOT DETECTED Final   Yersinia enterocolitica NOT DETECTED NOT DETECTED Final   Vibrio species NOT DETECTED NOT DETECTED Final   Vibrio cholerae NOT DETECTED NOT DETECTED Final   Enteroaggregative E coli (EAEC) NOT DETECTED NOT DETECTED Final   Enteropathogenic E coli (EPEC) NOT DETECTED NOT DETECTED Final   Enterotoxigenic E coli (ETEC) NOT DETECTED NOT DETECTED Final   Shiga like toxin producing E coli (STEC) NOT DETECTED NOT DETECTED Final   Shigella/Enteroinvasive E coli (EIEC) NOT DETECTED NOT DETECTED Final   Cryptosporidium NOT DETECTED NOT DETECTED Final   Cyclospora cayetanensis NOT DETECTED  NOT DETECTED Final   Entamoeba histolytica NOT DETECTED NOT DETECTED Final   Giardia lamblia NOT DETECTED NOT DETECTED Final   Adenovirus F40/41 NOT DETECTED NOT DETECTED Final   Astrovirus NOT DETECTED NOT DETECTED Final   Norovirus GI/GII NOT DETECTED NOT DETECTED Final   Rotavirus A NOT DETECTED NOT DETECTED Final   Sapovirus (I, II, IV, and V) NOT DETECTED NOT DETECTED Final  C difficile quick scan w PCR reflex     Status: None   Collection Time: 01/11/17  5:00 PM  Result Value Ref Range Status   C Diff antigen NEGATIVE NEGATIVE Final   C Diff toxin NEGATIVE NEGATIVE Final   C Diff interpretation No C. difficile detected.  Final     Labs: BNP (last 3 results) No results for input(s): BNP in the last 8760 hours. Basic Metabolic Panel:  Recent Labs Lab 01/11/17 1124 01/12/17 0355 01/13/17 0405  NA 138 138 138  K 2.8* 3.4* 3.4*  CL 104 105 107  CO2 24 27 27   GLUCOSE 152* 148* 149*  BUN 14 12 10   CREATININE 0.67 0.72 0.66  CALCIUM 8.1* 7.8* 7.6*  MG 1.6*  --   --    Liver Function Tests:  Recent Labs Lab 01/11/17 1124 01/12/17 0355  AST 18 17  ALT 14* 14*  ALKPHOS 78 71  BILITOT 1.1 0.9  PROT 6.0* 5.8*  ALBUMIN 2.4* 2.3*    Recent Labs Lab 01/11/17 1124  LIPASE 12   No results for input(s): AMMONIA in the last 168 hours. CBC:  Recent Labs Lab 01/11/17 1124 01/12/17 0355 01/13/17 0405  WBC 7.3 7.7 6.0  NEUTROABS 4.7  --   --   HGB 11.4* 10.7* 9.3*  HCT 33.5* 32.0* 27.8*  MCV 92.8 93.3 93.0  PLT 377 369 309   Cardiac Enzymes: No results for input(s): CKTOTAL, CKMB, CKMBINDEX, TROPONINI in the last 168 hours. BNP: Invalid input(s): POCBNP CBG: No results for input(s): GLUCAP in the last 168 hours. D-Dimer No results for input(s): DDIMER in the last 72 hours. Hgb A1c No results for input(s): HGBA1C in the last 72 hours. Lipid Profile No results for input(s): CHOL, HDL, LDLCALC, TRIG, CHOLHDL, LDLDIRECT in the last 72 hours. Thyroid  function studies No results for input(s): TSH, T4TOTAL, T3FREE, THYROIDAB in the last 72 hours.  Invalid input(s): FREET3 Anemia work up No results for input(s): VITAMINB12, FOLATE, FERRITIN, TIBC, IRON, RETICCTPCT in the last 72 hours. Urinalysis    Component Value Date/Time   COLORURINE YELLOW 01/11/2017 1248   APPEARANCEUR CLEAR 01/11/2017 1248   LABSPEC 1.029 01/11/2017 1248   PHURINE 6.5 01/11/2017 1248   GLUCOSEU NEGATIVE 01/11/2017 1248   HGBUR NEGATIVE 01/11/2017 [place] 01/11/2017 1248   KETONESUR 40 (A) 01/11/2017 1248   PROTEINUR NEGATIVE 01/11/2017 1248   UROBILINOGEN 0.2 04/12/2008 2025   NITRITE NEGATIVE 01/11/2017 1248   LEUKOCYTESUR SMALL (A) 01/11/2017 1248   Sepsis Labs Invalid input(s): PROCALCITONIN,  WBC,  LACTICIDVEN Microbiology Recent Results (from the past 240 hour(s))  Gastrointestinal Panel by PCR , Stool     Status: None   Collection Time: 01/11/17  5:00 AM  Result Value Ref Range Status   Campylobacter species NOT DETECTED NOT DETECTED Final   Plesimonas shigelloides NOT DETECTED NOT DETECTED Final   Salmonella species NOT DETECTED NOT DETECTED Final   Yersinia enterocolitica NOT DETECTED NOT DETECTED Final   Vibrio species NOT DETECTED NOT DETECTED Final   Vibrio cholerae NOT DETECTED NOT DETECTED Final   Enteroaggregative E coli (EAEC) NOT DETECTED NOT DETECTED Final   Enteropathogenic E coli (EPEC) NOT DETECTED NOT DETECTED Final   Enterotoxigenic E coli (ETEC) NOT DETECTED NOT DETECTED Final   Shiga like toxin producing E coli (STEC) NOT DETECTED NOT DETECTED Final   Shigella/Enteroinvasive E coli (EIEC) NOT DETECTED NOT DETECTED Final   Cryptosporidium NOT DETECTED NOT DETECTED Final   Cyclospora cayetanensis NOT DETECTED NOT DETECTED Final   Entamoeba histolytica NOT DETECTED NOT DETECTED Final   Giardia lamblia NOT DETECTED NOT DETECTED Final   Adenovirus F40/41 NOT DETECTED NOT DETECTED Final   Astrovirus NOT  DETECTED NOT DETECTED Final   Norovirus GI/GII NOT DETECTED NOT DETECTED Final   Rotavirus A NOT DETECTED NOT DETECTED Final   Sapovirus (I, II, IV, and V) NOT DETECTED NOT DETECTED Final  C difficile quick scan w PCR reflex     Status: None   Collection Time: 01/11/17  5:00 PM  Result Value Ref Range Status   C Diff antigen NEGATIVE NEGATIVE Final   C Diff toxin NEGATIVE NEGATIVE Final   C Diff interpretation No C. difficile detected.  Final     Time coordinating discharge: Over 30 minutes  SIGNED:   Cordelia Poche, MD [facility] 01/14/2017, 11:44 AM Pager [phone]  If 7PM-7AM, please contact night-coverage [email or web address] Password [email or web address]

## 2017-01-14 NOTE — NC FL2 (Signed)
  [place] LEVEL OF CARE SCREENING TOOL     IDENTIFICATION  Patient Name: Craig Jensen Birthdate: Jan 11, 1930 Sex: male Admission Date (Current Location): 01/11/2017  [facility] and [place] Number:  Herbalist and Address:  [facility],  [place], [place]      Provider Number: [id number]  Attending Physician Name and Address:  Mariel Aloe, MD  Relative Name and Phone Number:       Current Level of Care: Hospital Recommended Level of Care: [place] Prior Approval Number:    Date Approved/Denied:   PASRR Number: [id number]  Discharge Plan: SNF    Current Diagnoses: Patient Active Problem List   Diagnosis Date Noted  . Colitis 01/11/2017  . Hypokalemia 01/11/2017  . Malignant neoplasm of prostate ([place]) 08/14/2016  . S/p reverse total shoulder arthroplasty 01/11/2016    Orientation RESPIRATION BLADDER Height & Weight     Self, Time, Situation, Place  Normal Continent Weight: 218 lb (98.9 kg) Height:  6\' 1"  (185.4 cm)  BEHAVIORAL SYMPTOMS/MOOD NEUROLOGICAL BOWEL NUTRITION STATUS      Incontinent Diet (regular)  AMBULATORY STATUS COMMUNICATION OF NEEDS Skin   Limited Assist Verbally Normal                       Personal Care Assistance Level of Assistance  Bathing, Dressing, Feeding Bathing Assistance: Limited assistance Feeding assistance: Independent       Functional Limitations Info  Sight, Hearing, Speech Sight Info: Adequate Hearing Info: Adequate Speech Info: Adequate    SPECIAL CARE FACTORS FREQUENCY  PT (By licensed PT), OT (By licensed OT)     PT Frequency: 5x OT Frequency: 5x            Contractures Contractures Info: Not present    Additional Factors Info  Code Status, Allergies Code Status Info: full Allergies Info: nka           Current Medications (01/14/2017):  This is the current hospital active medication list Current Facility-Administered  Medications  Medication Dose Route Frequency Provider Last Rate Last Dose  . amLODipine (NORVASC) tablet 5 mg  5 mg Oral Daily Caren Griffins, MD   5 mg at 01/14/17 0945  . enoxaparin (LOVENOX) injection 40 mg  40 mg Subcutaneous Q24H Caren Griffins, MD   40 mg at 01/13/17 2043  . loperamide (IMODIUM) capsule 4 mg  4 mg Oral QID Willia Craze, NP   4 mg at 01/14/17 0537  . magnesium sulfate (IV Push/IM) injection 1 g  1 g Intravenous Once Julianne Rice, MD      . ondansetron [facility]) tablet 4 mg  4 mg Oral Q6H PRN Caren Griffins, MD       Or  . ondansetron (ZOFRAN) injection 4 mg  4 mg Intravenous Q6H PRN Caren Griffins, MD      . oxyCODONE (Oxy IR/ROXICODONE) immediate release tablet 5 mg  5 mg Oral Q4H PRN Caren Griffins, MD      . traZODone (DESYREL) tablet 25 mg  25 mg Oral QHS PRN Caren Griffins, MD   25 mg at 01/12/17 2144     Discharge Medications: Please see discharge summary for a list of discharge medications.  Relevant Imaging Results:  Relevant Lab Results:   Additional Information SS# [id number]  Nila Nephew, LCSW

## 2017-01-14 NOTE — Clinical Social Work Note (Signed)
Clinical Social Work Assessment  Patient Details  Name: Craig Jensen MRN: [id number] Date of Birth: 09/02/1930  Date of referral:  01/14/17               Reason for consult:  Discharge Planning                Permission sought to share information with:  Facility Contact Representative, Family Supports Permission granted to share information::  Yes, Verbal Permission Granted  Name::     Wife [name] [phone]  Agency::  [facility] staff  Relationship::     Contact Information:     Housing/Transportation Living arrangements for the past 2 months:  Single Family Home ([facility]- with wife) Source of Information:  Patient, Facility Patient Interpreter Needed:  None Criminal Activity/Legal Involvement Pertinent to Current Situation/Hospitalization:  No - Comment as needed Significant Relationships:  Adult Children, Spouse, Community Support Lives with:  Spouse Do you feel safe going back to the place where you live?  Yes Need for family participation in patient care:  No (Coment)  Care giving concerns:  Pt from [facility], has lived there for 5 years with his wife. Pt needing assistance with ambulation compared to his baseline, requests referral to SNF bed at [facility]. States she has not been to rehab before but is familiar with and agreeable rehab due to the benefit being available at his facility. Not interested in other SNFs. Per Candace at [facility], SNF bed available for pt.    Social Worker assessment / plan:  CSW consulted for potential SNF placement. Pt from [facility] at [facility]. Met with pt at bedside, pt requests referral to [facility].  CSW spoke with representative at [facility] and provided pt's information in the HUB. (completed FL2, obtained PASSR).  Plan: [facility] at [place].   Employment status:  Retired Insurance information:  Managed Medicare PT Recommendations:  24 Hour  Supervision, Skilled Nursing Facility Information / Referral to community resources:  Skilled Nursing Facility  Patient/Family's Response to care:  Engaged and appreciative of care.   Patient/Family's Understanding of and Emotional Response to Diagnosis, Current Treatment, and Prognosis:  Pt demonstrates adequate understanding of prognosis and care. Is relieved to have rehab available at his facility and hopes for seamless transition. Notes he feels positive since "his wife will be able to be right there."  Emotional Assessment Appearance:  Appears stated age Attitude/Demeanor/Rapport:   (pleasant) Affect (typically observed):  Accepting, Calm Orientation:  Oriented to Self, Oriented to Place, Oriented to  Time, Oriented to Situation Alcohol / Substance use:  Not Applicable Psych involvement (Current and /or in the community):  No (Comment)  Discharge Needs  Concerns to be addressed:  Discharge Planning Concerns Readmission within the last 30 days:  No Current discharge risk:  None Barriers to Discharge:  No Barriers Identified   Meghan R Stout, LCSW 01/14/2017, 10:26 AM  

## 2017-01-16 ENCOUNTER — Telehealth: Payer: Self-pay | Admitting: Radiation Oncology

## 2017-01-16 NOTE — Telephone Encounter (Signed)
Phoned patient back. Expressed our appreciation for the update on his status. Reassured him the care he is receiving from Dr. Henrene Pastor and the providers at [facility] are top notch. Explained the Imodium dosage is to be tapered over time as his proctitis resolved. Reassured him Dr. Tammi Klippel is in agreement with the change from two Imodium tablets every six hours down to one imodium tablet every four hours. Patient confirms significant improvement of his symptoms over the last 12-24 hours. Encouraged patient to contact this RN with future needs. Patient verbalized understanding of all reviewed and expressed appreciation for the return call.

## 2017-01-21 NOTE — Progress Notes (Signed)
83 y.o.gentleman with stage T1cadenocarcinoma of the prostate with a Gleason's score of 4+4and a PSA of 23 radiation completed 12-24-16, one month FU.

## 2017-01-29 ENCOUNTER — Ambulatory Visit
Admission: RE | Admit: 2017-01-29 | Discharge: 2017-01-29 | Disposition: A | Discharge status: Home / Self Care | Payer: Medicare Other | Source: Ambulatory Visit | Attending: Urology | Admitting: Urology

## 2017-02-10 ENCOUNTER — Encounter (HOSPITAL_BASED_OUTPATIENT_CLINIC_OR_DEPARTMENT_OTHER): Payer: Self-pay | Admitting: *Deleted

## 2017-02-10 ENCOUNTER — Emergency Department (HOSPITAL_BASED_OUTPATIENT_CLINIC_OR_DEPARTMENT_OTHER)
Admission: EM | Admit: 2017-02-10 | Discharge: 2017-02-10 | Disposition: A | Discharge status: Home / Self Care | Payer: Medicare Other | Attending: Emergency Medicine | Admitting: Emergency Medicine

## 2017-02-10 DIAGNOSIS — K529 Noninfective gastroenteritis and colitis, unspecified: Secondary | ICD-10-CM | POA: Insufficient documentation

## 2017-02-10 DIAGNOSIS — E119 Type 2 diabetes mellitus without complications: Secondary | ICD-10-CM | POA: Diagnosis not present

## 2017-02-10 DIAGNOSIS — R29898 Other symptoms and signs involving the musculoskeletal system: Secondary | ICD-10-CM | POA: Diagnosis not present

## 2017-02-10 DIAGNOSIS — R1907 Generalized intra-abdominal and pelvic swelling, mass and lump: Secondary | ICD-10-CM | POA: Diagnosis present

## 2017-02-10 NOTE — ED Provider Notes (Addendum)
[place] [facility] Provider Note: Craig Spurling, MD, FACEP  CSN: [id number] MRN: [id number] ARRIVAL: 02/10/17 at 0513 ROOM: [place]  growth on abdomen   HISTORY OF PRESENT ILLNESS  Craig Jensen is a 83 y.o. male who was diagnosed with prostate cancer about 6 months ago and underwent a course of 40 radiation treatments. As result of the radiation treatments he developed chronic diarrhea due to radiation proctitis. He was on chronic Imodium for several weeks but his diarrhea has improved and he no longer takes the Imodium. He still has frequent loose bowel movements. These are sometimes associated with bloating and abdominal pain which are relieved by moving his bowels. He was in bed earlier and felt what he thought to be a mass in his epigastrium. Concerned that this may be a tumor or other complication of his recent bout with cancer he came in to have it evaluated. It was more prominent earlier when his abdomen was bloated. It has not been and is not painful or tender. He is not having any associated nausea or vomiting. He is not having any abdominal pain apart from that associated with his diarrhea. He does have chronic edema of the lower legs since his cancer treatment.    Past Medical History:  Diagnosis Date  . Arthritis   . Cataract   . Diabetes mellitus without complication ([place])   . Hyperlipidemia   . Inguinal hernia   . Kidney stones   . Peripheral neuropathy   . Prostate cancer ([place])   . Prostate cancer [facility])     Past Surgical History:  Procedure Laterality Date  . COLONOSCOPY    . EYE SURGERY Bilateral    cataract surgery w/ lens implant  . HERNIA REPAIR Left    inguinal hernia  . INNER EAR SURGERY Right   . KIDNEY STONE SURGERY    . PROSTATE BIOPSY    . REVERSE SHOULDER ARTHROPLASTY Right 01/11/2016   Procedure: RIGHT REVERSE SHOULDER ARTHROPLASTY;  Surgeon: Justice Britain, MD;  Location: [facility];  Service: Orthopedics;  Laterality: Right;    Marland Kitchen VASECTOMY      Family History  Problem Relation Age of Onset  . Cancer Mother        uncertain  . Stroke Mother   . Heart disease Father   . Stroke Brother        brain aneurysm  . Heart disease Brother     Social History  Substance Use Topics  . Smoking status: Never Smoker  . Smokeless tobacco: Never Used  . Alcohol use Yes     Comment: very rare    Prior to Admission medications   Medication Sig Start Date End Date Taking? Authorizing Provider  Levothyroxine Sodium (SYNTHROID PO) Take by mouth.   Yes [provider]  amLODipine (NORVASC) 5 MG tablet Take 1 tablet (5 mg total) by mouth daily. 01/15/17   Mariel Aloe, MD    Allergies Patient has no known allergies.   REVIEW OF SYSTEMS  Negative except as noted here or in the History of Present Illness.   PHYSICAL EXAMINATION  Initial Vital Signs Blood pressure (!) 147/75, pulse 78, temperature 98.4 F (36.9 C), temperature source Oral, resp. rate 18, height 6' (1.829 m), weight 93 kg (205 lb), SpO2 100 %.  Examination General: Well-developed, well-nourished male in no acute distress; appearance consistent with age of record HENT: normocephalic; atraumatic Eyes: pupils equal, round and reactive to light; extraocular muscles intact  Neck: supple Heart: regular rate and rhythm Lungs: clear to auscultation bilaterally Chest: Prominent xiphoid process without tenderness or abnormal mobility Abdomen: soft; nondistended; nontender; no masses or hepatosplenomegaly; bowel sounds present Extremities: No deformity; full range of motion; 2+ pitting edema of lower legs Neurologic: Awake, alert and oriented; motor function intact in all extremities and symmetric; no facial droop Skin: Warm and dry Psychiatric: Normal mood and affect   RESULTS  Summary of this visit's results, reviewed by myself:   EKG Interpretation  Date/Time:    Ventricular Rate:    PR Interval:    QRS Duration:   QT Interval:     QTC Calculation:   R Axis:     Text Interpretation:        Laboratory Studies: No results found for this or any previous visit (from the past 24 hour(s)). Imaging Studies: No results found.  ED COURSE  Nursing notes and initial vitals signs, including pulse oximetry, reviewed.  Vitals:   02/10/17 0519 02/10/17 0520  BP:  (!) 147/75  Pulse:  78  Resp:  18  Temp:  98.4 F (36.9 C)  TempSrc:  Oral  SpO2:  100%  Weight: 93 kg (205 lb)   Height: 6' (1.829 m)    6:04 AM The patient and bits that he has been over vigilant about his health recently and may have "gotten paranoid" about what he palpated. He was reassured that on exam this appears to be a normal xiphoid. I do not believe imaging studies are indicated at this time. He has a follow-up appointment with his primary care physician, Dr. Reynaldo Minium, soon. He was advised to address any ongoing concerns at that time.  PROCEDURES    ED DIAGNOSES     ICD-9-CM ICD-10-CM   1. Chronic diarrhea 787.91 K52.9   2. Xiphoid prominence 733.99 R29.898        Shanon Rosser, MD 02/10/17 0604    Shanon Rosser, MD 02/10/17 [phone]

## 2017-02-10 NOTE — ED Triage Notes (Addendum)
Pt states recent hx of prostate cancer with treatments in Jan. States he has had issues with diarrhea for weeks but states this has been improving. Denies any abd pain, n/v. Pt here this morning with a concern that he felt a "lump" to his upper abdomen area. MD at bedside with this RN for triage. MD palpated and states this is his xyphoid process which is prominent.

## 2017-03-06 ENCOUNTER — Telehealth: Payer: Self-pay | Admitting: Radiation Oncology

## 2017-03-06 NOTE — Telephone Encounter (Signed)
Received voicemail message from patient requesting return call. Phoned patient. Patient reports he is doing well and his diarrhea has greatly improved. Patient explains he is seeing his PCP every 90 days and his blood work has returned to normal. Also, he reports he is scheduled to follow up with his urologist in July. Patient request to cancel July 12th follow up with Dr. Tammi Klippel. Cancelled appointment. Encouraged patient to call back with future needs.

## 2017-03-20 ENCOUNTER — Ambulatory Visit: Payer: Medicare Other | Admitting: Urology

## 2017-12-13 IMAGING — NM NM BONE WHOLE BODY
4 series · 4 of 4 positions shown · non-contrast
Comparison: CT 08/07/2016 .

CLINICAL DATA: Prostate cancer.

EXAM:
NUCLEAR MEDICINE WHOLE BODY BONE SCAN
TECHNIQUE: Whole body anterior and posterior images were obtained approximately
3 hours after intravenous injection of radiopharmaceutical.
RADIOPHARMACEUTICALS:  21 mCi Lechnetium-NNm MDP IV

[Series 1: whole body · 2.66mm/px · 1 of 1 slices shown (1 of 2)]
[im 1/1]
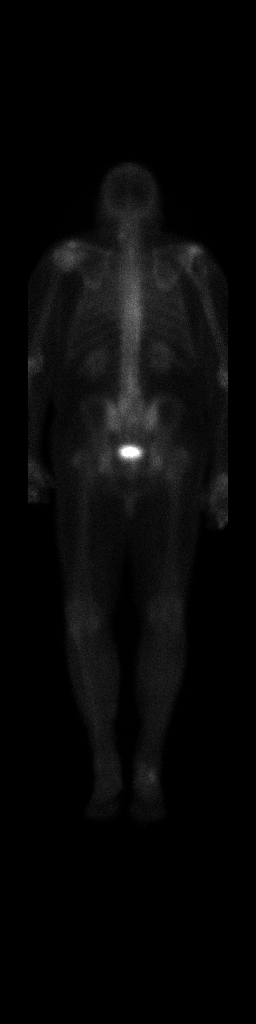

[Series 1: whole body · 2.66mm/px · 1 of 1 slices shown (2 of 2)]
[im 1/1]
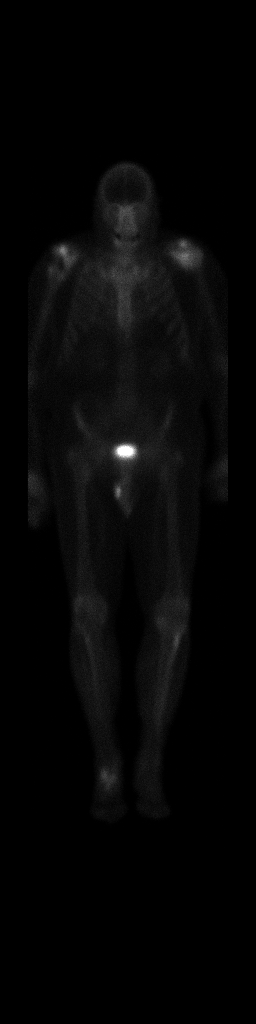

[Series 1: wbr_bone_40 whole body · 2.66mm/px · 1 of 1 slices shown (1 of 2)]
[im 1/1]
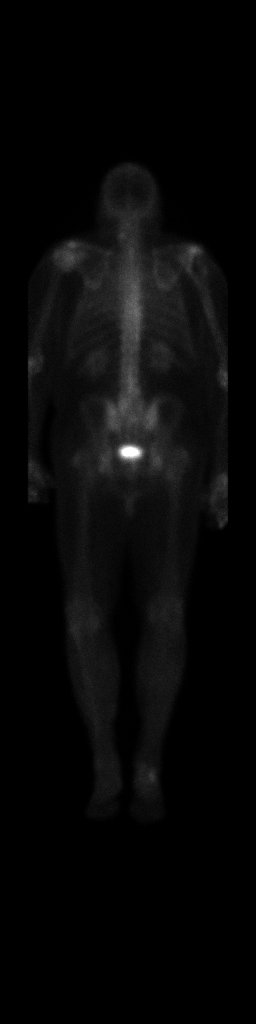

[Series 1: wbr_bone_40 whole body · 2.66mm/px · 1 of 1 slices shown (2 of 2)]
[im 1/1]
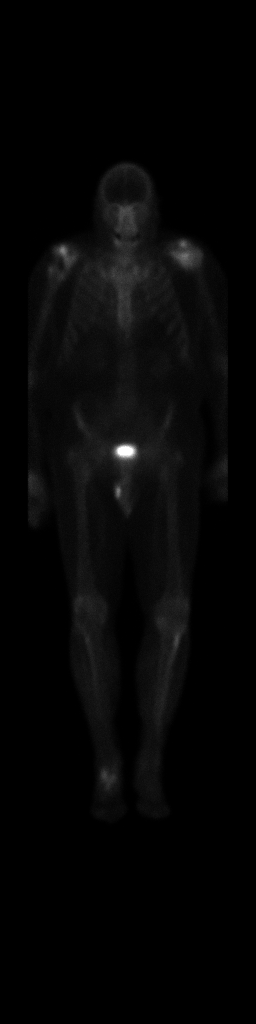

[4 of 4 positions shown; findings below may reference images not displayed]

FINDINGS: Bilateral renal function and excretion. Increased activity noted
about both shoulders. Increased activity noted over the right foot.
These changes are most likely degenerative. Plain film evaluation of
these regions can be obtained as needed. No findings noted to
suggest metastatic disease.
IMPRESSION: 1. Increased activity noted over both shoulders and right ankle.
These changes are most likely degenerative. Plain film evaluation
can be obtained for confirmation.

2. No other abnormality noted to suggest metastatic disease.

## 2018-01-09 ENCOUNTER — Other Ambulatory Visit: Payer: Self-pay | Admitting: Ophthalmology

## 2019-09-16 DIAGNOSIS — E7849 Other hyperlipidemia: Secondary | ICD-10-CM | POA: Diagnosis present

## 2020-11-03 ENCOUNTER — Other Ambulatory Visit: Payer: Self-pay

## 2020-11-03 ENCOUNTER — Emergency Department (HOSPITAL_BASED_OUTPATIENT_CLINIC_OR_DEPARTMENT_OTHER): Payer: Medicare Other

## 2020-11-03 ENCOUNTER — Encounter (HOSPITAL_BASED_OUTPATIENT_CLINIC_OR_DEPARTMENT_OTHER): Payer: Self-pay | Admitting: *Deleted

## 2020-11-03 ENCOUNTER — Emergency Department (HOSPITAL_BASED_OUTPATIENT_CLINIC_OR_DEPARTMENT_OTHER)
Admission: EM | Admit: 2020-11-03 | Discharge: 2020-11-03 | Disposition: A | Discharge status: Home / Self Care | Payer: Medicare Other | Attending: Emergency Medicine | Admitting: Emergency Medicine

## 2020-11-03 DIAGNOSIS — E119 Type 2 diabetes mellitus without complications: Secondary | ICD-10-CM | POA: Insufficient documentation

## 2020-11-03 DIAGNOSIS — Z8546 Personal history of malignant neoplasm of prostate: Secondary | ICD-10-CM | POA: Insufficient documentation

## 2020-11-03 DIAGNOSIS — Z96611 Presence of right artificial shoulder joint: Secondary | ICD-10-CM | POA: Insufficient documentation

## 2020-11-03 DIAGNOSIS — M62838 Other muscle spasm: Secondary | ICD-10-CM

## 2020-11-03 DIAGNOSIS — W1830XA Fall on same level, unspecified, initial encounter: Secondary | ICD-10-CM | POA: Insufficient documentation

## 2020-11-03 DIAGNOSIS — S99921A Unspecified injury of right foot, initial encounter: Secondary | ICD-10-CM | POA: Diagnosis present

## 2020-11-03 DIAGNOSIS — S92341A Displaced fracture of fourth metatarsal bone, right foot, initial encounter for closed fracture: Secondary | ICD-10-CM | POA: Diagnosis not present

## 2020-11-03 NOTE — ED Notes (Signed)
In xray

## 2020-11-03 NOTE — Discharge Instructions (Signed)
You were seen in the emergency department for evaluation of right foot pain after a fall.  You have a possible fracture of your fourth metatarsal bone.  This is usually treated conservatively with a hard soled shoe.  Please contact Dr. Luanna Cole office for follow-up.  We have also placed a referral in to neurology for your leg spasms.

## 2020-11-03 NOTE — ED Provider Notes (Signed)
[place] EMERGENCY DEPARTMENT Provider Note   CSN: [id number] Arrival date & time: 11/03/20  1617     History Chief Complaint  Patient presents with  . Extremity Weakness    Craig Jensen is a 87 y.o. male.  He is here for evaluation of right foot pain after a fall.  This occurred today.  He said he was getting up from sitting to standing when his legs started spasming and he could not reach for something to hold on to and he ended up falling to the ground.  He denied any loss of consciousness.  He said these leg spasms been going on for about a month.  They usually occur after going from sitting for a little while and when he stands up the leg will involuntarily flex up.  It will jerk up to 10 times and then it will be back to normal.  Then he can walk on it with any difficulty.  Has not had any work-up for this.  The history is provided by the patient.  Foot Injury Location:  Foot Foot location:  [place] of R foot Pain details:    Quality:  Sharp   Radiates to:  Does not radiate   Severity:  Mild   Onset quality:  Sudden   Progression:  Unchanged Chronicity:  New Foreign body present:  No foreign bodies Relieved by:  Elevation Worsened by:  Bearing weight Ineffective treatments:  None tried Associated symptoms: no fever, no neck pain, no numbness and no tingling        Past Medical History:  Diagnosis Date  . Arthritis   . Cataract   . Diabetes mellitus without complication ([place])   . Hyperlipidemia   . Inguinal hernia   . Kidney stones   . Peripheral neuropathy   . Prostate cancer ([place])   . Prostate cancer [facility])     Patient Active Problem List   Diagnosis Date Noted  . Colitis 01/11/2017  . Hypokalemia 01/11/2017  . Malignant neoplasm of prostate ([place]) 08/14/2016  . S/p reverse total shoulder arthroplasty 01/11/2016    Past Surgical History:  Procedure Laterality Date  . COLONOSCOPY    . EYE SURGERY Bilateral    cataract surgery w/ lens  implant  . HERNIA REPAIR Left    inguinal hernia  . INNER EAR SURGERY Right   . KIDNEY STONE SURGERY    . PROSTATE BIOPSY    . REVERSE SHOULDER ARTHROPLASTY Right 01/11/2016   Procedure: RIGHT REVERSE SHOULDER ARTHROPLASTY;  Surgeon: Justice Britain, MD;  Location: [place];  Service: Orthopedics;  Laterality: Right;  Marland Kitchen VASECTOMY         Family History  Problem Relation Age of Onset  . Cancer Mother        uncertain  . Stroke Mother   . Heart disease Father   . Stroke Brother        brain aneurysm  . Heart disease Brother     Social History   Tobacco Use  . Smoking status: Never Smoker  . Smokeless tobacco: Never Used  Vaping Use  . Vaping Use: Never used  Substance Use Topics  . Alcohol use: Yes    Comment: very rare  . Drug use: No    Home Medications Prior to Admission medications   Medication Sig Start Date End Date Taking? Authorizing Provider  amLODipine (NORVASC) 5 MG tablet Take 1 tablet (5 mg total) by mouth daily. 01/15/17   Mariel Aloe, MD  Levothyroxine Sodium (SYNTHROID PO) Take by mouth.    [provider]    Allergies    Patient has no known allergies.  Review of Systems   Review of Systems  Constitutional: Negative for fever.  Eyes: Negative for visual disturbance.  Musculoskeletal: Negative for neck pain.  Skin: Negative for wound.  Neurological: Positive for tremors. Negative for headaches.    Physical Exam Updated Vital Signs BP (!) 190/85 (BP Location: Left Arm)   Pulse 73   Temp 98.6 F (37 C) (Oral)   Resp 18   Ht 6' (1.829 m)   Wt 104.3 kg   SpO2 98%   BMI 31.19 kg/m   Physical Exam Vitals and nursing note reviewed.  Constitutional:      Appearance: He is well-developed and well-nourished.  HENT:     Head: Normocephalic and atraumatic.  Eyes:     Conjunctiva/sclera: Conjunctivae normal.  Pulmonary:     Effort: Pulmonary effort is normal.  Musculoskeletal:        General: Tenderness present.     Cervical back:  Neck supple.     Comments: Right lower extremity nontender hip knee ankle.  He has some tenderness over the lateral side of his foot mostly at the MTP of the fifth.  No open wounds.  Distal pulses intact.  Skin:    General: Skin is warm and dry.     Capillary Refill: Capillary refill takes less than 2 seconds.  Neurological:     General: No focal deficit present.     Mental Status: He is alert.     GCS: GCS eye subscore is 4. GCS verbal subscore is 5. GCS motor subscore is 6.  Psychiatric:        Mood and Affect: Mood and affect normal.     ED Results / Procedures / Treatments   Labs (all labs ordered are listed, but only abnormal results are displayed) Labs Reviewed - No data to display  EKG None  Radiology DG Foot Complete Right  Addendum Date: 11/03/2020   ADDENDUM REPORT: 11/03/2020 17:40 ADDENDUM: Request made to address discrepancy between impression and tagged images. Comparison is made with right foot radiograph 01/19/2013. Bones appear osteopenic. Chronic deformities at the heads of the second third and fourth proximal phalanges. Possible acute on chronic deformity at the fifth proximal phalanx base. Acute nondisplaced fracture at the neck of the fourth metatarsal. No definitive fracture lucency at the fifth metatarsal. IMPRESSION: 1. Possible acute on chronic fracture at the base of the fifth proximal phalanx (not metatarsal) 2. Acute nondisplaced fracture at the neck of the fourth metatarsal Results discussed with Dr. Melina Copa of the ED at approximately 5:30 p.m. Electronically Signed   By: Donavan Foil M.D.   On: 11/03/2020 17:40   Result Date: 11/03/2020 CLINICAL DATA:  Twisted RIGHT foot today after he collapsed. EXAM: RIGHT FOOT COMPLETE - 3+ VIEW COMPARISON:  None. FINDINGS: There is an acute fracture of the base of the 5th metatarsal, associated with minimal displacement. Hallux valgus deformity. No radiopaque foreign body or soft tissue gas. IMPRESSION: Acute fracture of  the 5th metatarsal base. Electronically Signed: By: Nolon Nations M.D. On: 11/03/2020 17:01    Procedures Procedures   Medications Ordered in ED Medications - No data to display  ED Course  I have reviewed the triage vital signs and the nursing notes.  Pertinent labs & imaging results that were available during my care of the patient were reviewed by me and considered  in my medical decision making (see chart for details).  Clinical Course as of 11/04/20 1004  Fri Nov 03, 2020  1732 Reading of patient's x-rays says fifth metatarsal base fracture.  I reviewed the films and did not see that.  I talked to on-call radiology and she agrees that there is no fifth metatarsal base fracture.  She does think there may be a neck of the fourth metatarsal and is going to place another report. [MB]    Clinical Course User Index [MB] Hayden Rasmussen, MD   MDM Rules/Calculators/A&P                         Differential includes fracture, contusion, sprain, dislocation.  Reviewed radiology imaging with radiology.  Patient will be given a postop shoe.  Given information for orthopedic follow-up.  Also placed a referral to neurology for him as it sounds like he is having some type of neurologic process that is causing his leg to spasm.  Return instructions discussed  Final Clinical Impression(s) / ED Diagnoses Final diagnoses:  Closed displaced fracture of fourth metatarsal bone of right foot, initial encounter  Muscle spasm of right lower extremity    Rx / DC Orders ED Discharge Orders    None       Hayden Rasmussen, MD 11/04/20 1006

## 2020-11-03 NOTE — ED Triage Notes (Addendum)
C/o intermittent right leg numbness and jerking episodes . 10 episodes with in 2 months , today fall with episode and right foot injury

## 2020-11-13 ENCOUNTER — Ambulatory Visit: Payer: Medicare Other | Admitting: Neurology

## 2020-11-13 ENCOUNTER — Other Ambulatory Visit: Payer: Self-pay

## 2020-11-13 ENCOUNTER — Encounter: Payer: Self-pay | Admitting: Neurology

## 2020-11-13 VITALS — BP 186/73 | HR 74 | Ht 74.0 in | Wt 239.0 lb

## 2020-11-13 DIAGNOSIS — R29898 Other symptoms and signs involving the musculoskeletal system: Secondary | ICD-10-CM

## 2020-11-13 DIAGNOSIS — R259 Unspecified abnormal involuntary movements: Secondary | ICD-10-CM

## 2020-11-13 NOTE — Patient Instructions (Signed)
MRI brain and EEG will be ordered.  Electroencephalogram (EEG) Instructions  To prepare for your EEG: . Wash your hair the night before or the day of the test, but don't use any conditioners, hair creams, sprays or styling gels.  . Avoid anything with caffeine 6 hours before the test. . Take your usual medications unless instructed otherwise. If you're supposed to sleep during your EEG test, your doctor may ask you to sleep less or even avoid sleep entirely the night before your EEG. If you have trouble falling asleep for the test, you might be given a sedative to help you relax.   What to expect You'll feel little or no discomfort during an EEG. The electrodes don't transmit any sensations. They just record your brain waves. If you need to sleep during the EEG, you might be given a sedative beforehand to help you relax. During the test: . A technician measures your head and marks your scalp with a type of pencil, to indicate where to attach the electrodes. Those spots on your scalp may be scrubbed with a gritty cream to improve the quality of the recording.  . A technician attaches flat metal discs (electrodes) to your scalp using a special adhesive. The electrodes are connected with wires to an instrument that amplifies - makes bigger - the brain waves and records them on computer equipment. Some people wear and elastic cap fitted with electrodes, instead of having the adhesive applied to their scalps. Once the electrodes are in place, an EEG typically takes 30-60 minutes.  . You relax in a comfortable position with your eyes closed during the test. At various times, the technician may ask you to open and close your eyes, perform a few simple calculations, read a paragraph, look at a picture, breathe deeply (hyperventilate) for a few minutes, or look at a flashing light. .  After the test After the test, the technician removes the electrodes or cap. If no sedative was given, you should feel no  side effects after the procedure, and you can return to your normal routine.  If you used a sedative, it may take about an hour to partially recover from the medication. You'll need someone to take you home because it can take up to a day for the full effects of the sedative to wear off. Rest and don't drive for the remainder of the day.

## 2020-11-13 NOTE — Progress Notes (Signed)
[facility] Clinic Note - Initial Visit   Date: 11/13/20  DEEP BONAWITZ MRN: [id number] DOB: 02-18-1930   Dear Dr. Melina Copa:  Thank you for your kind referral of [facility] [place] for consultation of right leg movements. Although his history is well known to you, please allow [place] to reiterate it for the purpose of our medical record. The patient was accompanied to the clinic by wife who also provides collateral information.     History of Present Illness: Craig Jensen is a 87 y.o. right-handed male with prostate cancer s/p radiation, hyperlipidemia, and hypothyroidism presenting for evaluation of right leg movements.  Starting in late January, he stood up from his chair and his right leg started to violently jerking and shaking.  He felt that his leg was asleep.  It lasted < 30 seconds and then resolves.  The first time, he called his wife, who was able to support him and help him until it subsided.  There was no loss of consciousness, tongue biting, incontinence, or weakness.  He has noticed that it always occur in the setting of standing up.  He has had about 6-8 spells.  With his most recent event, his wife was not present to help support him, so he fell and broke his right foot.  He went to the ER where he was given a boot to wear.   Past Medical History:  Diagnosis Date  . Arthritis   . Cataract   . Diabetes mellitus without complication ([place])   . Hyperlipidemia   . Inguinal hernia   . Kidney stones   . Peripheral neuropathy   . Prostate cancer ([place])   . Prostate cancer [facility])     Past Surgical History:  Procedure Laterality Date  . COLONOSCOPY    . EYE SURGERY Bilateral    cataract surgery w/ lens implant  . HERNIA REPAIR Left    inguinal hernia  . INNER EAR SURGERY Right   . KIDNEY STONE SURGERY    . PROSTATE BIOPSY    . REVERSE SHOULDER ARTHROPLASTY Right 01/11/2016   Procedure: RIGHT REVERSE SHOULDER ARTHROPLASTY;  Surgeon: Justice Britain, MD;  Location: [place];  Service: Orthopedics;  Laterality: Right;  Marland Kitchen VASECTOMY       Medications:  Outpatient Encounter Medications as of 11/13/2020  Medication Sig  . ibuprofen (ADVIL) 200 MG tablet Take 200 mg by mouth every 6 (six) hours as needed.  Marland Kitchen levothyroxine (SYNTHROID) 25 MCG tablet Take 25 mcg by mouth daily.  . metFORMIN (GLUCOPHAGE-XR) 500 MG 24 hr tablet Take 500 mg by mouth 2 (two) times daily.  . metoprolol succinate (TOPROL-XL) 25 MG 24 hr tablet Take 1 tablet by mouth daily.  . tamsulosin (FLOMAX) 0.4 MG CAPS capsule 1 capsule  . amLODipine (NORVASC) 5 MG tablet Take 1 tablet (5 mg total) by mouth daily. (Patient not taking: Reported on 11/13/2020)  . Levothyroxine Sodium (SYNTHROID PO) Take by mouth. (Patient not taking: Reported on 11/13/2020)   No facility-administered encounter medications on file as of 11/13/2020.    Allergies: No Known Allergies  Family History: Family History  Problem Relation Age of Onset  . Cancer Mother        uncertain  . Stroke Mother   . Heart disease Father   . Stroke Brother        brain aneurysm  . Heart disease Brother     Social History: Social History   Tobacco Use  . Smoking status: Never  Smoker  . Smokeless tobacco: Never Used  Vaping Use  . Vaping Use: Never used  Substance Use Topics  . Alcohol use: Yes    Comment: very rare  . Drug use: No   Social History   Social History Narrative   Right Handed   Lives in a one story home    Drinks no caffeine     Vital Signs:  BP (!) 186/73   Pulse 74   Ht 6\' 2"  (1.88 m)   Wt 239 lb (108.4 kg)   SpO2 94%   BMI 30.69 kg/m   Neurological Exam: MENTAL STATUS including orientation to time, place, person, recent and remote memory, attention span and concentration, language, and fund of knowledge is normal.  Speech is not dysarthric.  CRANIAL NERVES: II:  No visual field defects.   III-IV-VI: Pupils equal round and reactive to light.  Normal conjugate,  extra-ocular eye movements in all directions of gaze.  No nystagmus.  No ptosis.   V:  Normal facial sensation.    VII:  Normal facial symmetry and movements.   VIII:  Normal hearing and vestibular function.   IX-X:  Normal palatal movement.   XI:  Normal shoulder shrug and head rotation.   XII:  Normal tongue strength and range of motion, no deviation or fasciculation.  MOTOR:  Right leg with marked edema, erythema over the distal lower leg.  He has bruises over the right toes.  No atrophy, fasciculations or abnormal movements.  No pronator drift.   Upper Extremity:  Right  Left  Deltoid  5/5   5/5   Biceps  5/5   5/5   Triceps  5/5   5/5   Infraspinatus 5/5  5/5  Medial pectoralis 5/5  5/5  Wrist extensors  5/5   5/5   Wrist flexors  5/5   5/5   Finger extensors  5/5   5/5   Finger flexors  5/5   5/5   Dorsal interossei  5/5   5/5   Abductor pollicis  5/5   5/5   Tone (Ashworth scale)  0  0   Lower Extremity:  Right  Left  Hip flexors  5/5   5/5   Hip extensors  5/5   5/5   Adductor 5/5  5/5  Abductor 5/5  5/5  Knee flexors  5/5   5/5   Knee extensors  5/5   5/5   Dorsiflexors  4/5   4/5   Plantarflexors  4/5   4/5   Toe extensors  4/5   4/5   Toe flexors  4/5   4/5   Tone (Ashworth scale)  0  0   MSRs:  Right        Left                  brachioradialis 2+  2+  biceps 2+  2+  triceps 2+  2+  patellar 1+  1+  ankle jerk 0  0  Hoffman no  no  plantar response down  down   SENSORY:  Reduced vibration at the ankles bilaterally, temperature absent distal to the lower legs.    COORDINATION/GAIT: Normal finger-to- nose-finger.  Intact rapid alternating movements bilaterally. Gait is mildly wide based, slight steppage on the left, he is wear a boot and gait assisted with walker.     IMPRESSION: 1. Involuntary jerking of the right leg, need to evaluate for focal seizures.     -  MRI brain wwo contrast (contrasted study given history of prostate cancer)  - Routine  EEG  2. Right leg swelling, erythema, s/p toe fracture  - Follow-up with PCP asap  Further recommendations pending results.    Thank you for allowing me to participate in patient's care.  If I can answer any additional questions, I would be pleased to do so.    Sincerely,    Tashia Leiterman K. Posey Pronto, DO

## 2020-11-20 ENCOUNTER — Other Ambulatory Visit: Payer: Self-pay

## 2020-11-20 ENCOUNTER — Ambulatory Visit (INDEPENDENT_AMBULATORY_CARE_PROVIDER_SITE_OTHER): Payer: Medicare Other | Admitting: Neurology

## 2020-11-20 DIAGNOSIS — R259 Unspecified abnormal involuntary movements: Secondary | ICD-10-CM

## 2020-11-20 DIAGNOSIS — R29898 Other symptoms and signs involving the musculoskeletal system: Secondary | ICD-10-CM

## 2020-11-24 NOTE — Procedures (Signed)
ELECTROENCEPHALOGRAM REPORT  Date of Study: 11/20/2020  Patient's Name: Craig Jensen MRN: [id number] Date of Birth: Jan 06, 1930  Referring Provider: Dr. Narda Amber  Clinical History: This is an 87 year old man with involuntary right leg movements  Medications: Synthroid Glucophage Toprol Flomax  Technical Summary: A multichannel digital EEG recording measured by the international 10-20 system with electrodes applied with paste and impedances below 5000 ohms performed in our laboratory with EKG monitoring in an awake and asleep patient.  Hyperventilation was not performed. Photic stimulation was performed.  The digital EEG was referentially recorded, reformatted, and digitally filtered in a variety of bipolar and referential montages for optimal display.    Description: The patient is awake and asleep during the recording.  During maximal wakefulness, there is a symmetric, medium voltage 10 Hz posterior dominant rhythm that attenuates with eye opening.  The record is symmetric.  During drowsiness and sleep, there is an increase in theta slowing of the background.  Vertex waves and symmetric sleep spindles were seen. Photic stimulation did not elicit any abnormalities.  There were no epileptiform discharges or electrographic seizures seen.    EKG lead showed occasional irregular rhythm.  Impression: This awake and asleep EEG is normal.    Clinical Correlation: A normal EEG does not exclude a clinical diagnosis of epilepsy.  If further clinical questions remain, prolonged EEG may be helpful.  Clinical correlation is advised.   Ellouise Newer, M.D.

## 2020-11-27 ENCOUNTER — Telehealth: Payer: Self-pay | Admitting: Neurology

## 2020-12-04 ENCOUNTER — Telehealth: Payer: Self-pay | Admitting: Neurology

## 2020-12-04 NOTE — Telephone Encounter (Signed)
Pt called and informed that EEG was normal

## 2020-12-04 NOTE — Telephone Encounter (Signed)
Patient states he had an EEG 2 weeks ago and hasn't heard back regarding his results yet. Please call with results if available.

## 2020-12-04 NOTE — Telephone Encounter (Signed)
Please inform pt that his EEG was normal. Thanks.

## 2020-12-05 ENCOUNTER — Inpatient Hospital Stay: Admission: RE | Admit: 2020-12-05 | Payer: Medicare Other | Source: Ambulatory Visit

## 2020-12-05 ENCOUNTER — Other Ambulatory Visit: Payer: Self-pay | Admitting: Neurology

## 2020-12-05 DIAGNOSIS — R29898 Other symptoms and signs involving the musculoskeletal system: Secondary | ICD-10-CM

## 2020-12-05 DIAGNOSIS — R259 Unspecified abnormal involuntary movements: Secondary | ICD-10-CM

## 2020-12-06 ENCOUNTER — Other Ambulatory Visit: Payer: Self-pay

## 2020-12-06 ENCOUNTER — Ambulatory Visit
Admission: RE | Admit: 2020-12-06 | Discharge: 2020-12-06 | Disposition: A | Discharge status: Home / Self Care | Payer: Medicare Other | Source: Ambulatory Visit | Attending: Neurology | Admitting: Neurology

## 2020-12-06 ENCOUNTER — Other Ambulatory Visit: Payer: Medicare Other

## 2020-12-06 DIAGNOSIS — R29898 Other symptoms and signs involving the musculoskeletal system: Secondary | ICD-10-CM

## 2020-12-06 DIAGNOSIS — R259 Unspecified abnormal involuntary movements: Secondary | ICD-10-CM

## 2021-03-28 ENCOUNTER — Other Ambulatory Visit: Payer: Self-pay | Admitting: Urology

## 2021-03-29 ENCOUNTER — Encounter (HOSPITAL_COMMUNITY)
Admission: RE | Admit: 2021-03-29 | Discharge: 2021-03-29 | Disposition: A | Discharge status: Home / Self Care | Payer: Medicare Other | Source: Ambulatory Visit | Attending: Urology | Admitting: Urology

## 2021-03-29 ENCOUNTER — Encounter (HOSPITAL_COMMUNITY): Payer: Self-pay

## 2021-03-29 ENCOUNTER — Other Ambulatory Visit: Payer: Self-pay

## 2021-03-29 DIAGNOSIS — Z01818 Encounter for other preprocedural examination: Secondary | ICD-10-CM | POA: Diagnosis present

## 2021-03-29 HISTORY — DX: Essential (primary) hypertension: I10

## 2021-03-29 HISTORY — DX: Personal history of urinary calculi: Z87.442

## 2021-03-29 HISTORY — DX: Hypothyroidism, unspecified: E03.9

## 2021-03-29 LAB — BASIC METABOLIC PANEL
Anion gap: 8 (ref 5–15)
BUN: 18 mg/dL (ref 8–23)
CO2: 25 mmol/L (ref 22–32)
Calcium: 8.8 mg/dL — ABNORMAL LOW (ref 8.9–10.3)
Chloride: 106 mmol/L (ref 98–111)
Creatinine, Ser: 0.93 mg/dL (ref 0.61–1.24)
GFR, Estimated: >60 mL/min (ref >60)
Glucose, Bld: 127 mg/dL — ABNORMAL HIGH (ref 70–99)
Potassium: 4.6 mmol/L (ref 3.5–5.1)
Sodium: 139 mmol/L (ref 135–145)

## 2021-03-29 LAB — HEMOGLOBIN A1C
Hgb A1c MFr Bld: 7.3 % — ABNORMAL HIGH (ref 4.8–5.6)
Mean Plasma Glucose: 162.81 mg/dL

## 2021-03-29 LAB — CBC
HCT: 40.1 % (ref 39.0–52.0)
Hemoglobin: 13.1 g/dL (ref 13.0–17.0)
MCH: 31 pg (ref 26.0–34.0)
MCHC: 32.7 g/dL (ref 30.0–36.0)
MCV: 95 fL (ref 80.0–100.0)
Platelets: 259 10*3/uL (ref 150–400)
RBC: 4.22 MIL/uL (ref 4.22–5.81)
RDW: 13.2 % (ref 11.5–15.5)
WBC: 8 10*3/uL (ref 4.0–10.5)
nRBC: 0 % (ref 0.0–0.2)

## 2021-03-29 LAB — GLUCOSE, CAPILLARY: Glucose-Capillary: 129 mg/dL — ABNORMAL HIGH (ref 70–99)

## 2021-03-29 NOTE — Progress Notes (Signed)
COVID Vaccine Completed: yes Date COVID Vaccine completed:03/09/20- Pt had 2 boosters COVID vaccine manufacturer:  Moderna    PCP - Dr. Deirdre Pippins LOV 01/24/21 Cardiologist - no  Chest x-ray - no EKG - 03/29/21-chart Stress Test - no ECHO - no Cardiac Cath - NA Pacemaker/ICD device last checked:NA  Sleep Study - no CPAP -   Fasting Blood Sugar - 150-160 Checks Blood Sugar _____ times a day 2 times a week  Blood Thinner Instructions:no Aspirin Instructions: Last Dose:  Anesthesia review: no  Patient denies shortness of breath, fever, cough and chest pain at PAT appointment Pr reports no SOB with ADLs. He doesn't do stairs or housework.  Patient verbalized understanding of instructions that were given to them at the PAT appointment. Patient was also instructed that they will need to review over the PAT instructions again at home before surgery. yes

## 2021-03-29 NOTE — Patient Instructions (Signed)
DUE TO COVID-19 ONLY ONE VISITOR IS ALLOWED TO COME WITH YOU AND STAY IN THE WAITING ROOM ONLY DURING PRE OP AND PROCEDURE DAY OF SURGERY. THE 1 VISITOR  MAY VISIT WITH YOU AFTER SURGERY IN YOUR PRIVATE ROOM DURING VISITING HOURS ONLY!                 [place]     Your procedure is scheduled on: 04/03/21   Report to [facility] Main  Entrance   Report to admitting at  12:30 PM     Call this number if you have problems the morning of surgery [phone]    Remember: Do not eat food after Midnight.   You may have clear liquids until 11:30 AM    CLEAR LIQUID DIET   Foods Allowed                                                                     Foods Excluded  Coffee and tea, regular and decaf                             liquids that you cannot  Plain Jell-O any favor except red or purple                                           see through such as: Fruit ices (not with fruit pulp)                                     milk, soups, orange juice  Iced Popsicles                                    All solid food Carbonated beverages, regular and diet                                    Cranberry, grape and apple juices Sports drinks like Gatorade Lightly seasoned clear broth or consume(fat free) Sugar, honey syrup       BRUSH YOUR TEETH MORNING OF SURGERY AND RINSE YOUR MOUTH OUT, NO CHEWING GUM CANDY OR MINTS.     Take these medicines the morning of surgery with A SIP OF WATER: Metoprolol, Levothyroxine                                You may not have any metal on your body including              piercings  Do not wear jewelry, lotions, powders or deodorant             Men may shave face and neck.   Do not bring valuables to the hospital. [place]  FOR VALUABLES.  Contacts, dentures or bridgework may not be worn into surgery.       Patients discharged the day of surgery will not be allowed to drive home.   IF YOU  ARE HAVING SURGERY AND GOING HOME THE SAME DAY, YOU MUST HAVE AN ADULT TO DRIVE YOU HOME AND BE WITH YOU FOR 24 HOURS.   YOU MAY GO HOME BY TAXI OR UBER OR ORTHERWISE, BUT AN ADULT MUST ACCOMPANY YOU HOME AND STAY WITH YOU FOR 24 HOURS.  Name and phone number of your driver:  Special Instructions: N/A              Please read over the following fact sheets you were given: _____________________________________________________________________             [facility] - Preparing for Surgery Before surgery, you can play an important role.  Because skin is not sterile, your skin needs to be as free of germs as possible.  You can reduce the number of germs on your skin by washing with CHG (chlorahexidine gluconate) soap before surgery.  CHG is an antiseptic cleaner which kills germs and bonds with the skin to continue killing germs even after washing. Please DO NOT use if you have an allergy to CHG or antibacterial soaps.  If your skin becomes reddened/irritated stop using the CHG and inform your nurse when you arrive at Short Stay.  You may shave your face/neck.  Please follow these instructions carefully:  1.  Shower with CHG Soap the night before surgery and the  morning of Surgery.  2.  If you choose to wash your hair, wash your hair first as usual with your  normal  shampoo.  3.  After you shampoo, rinse your hair and body thoroughly to remove the  shampoo.                                        4.  Use CHG as you would any other liquid soap.  You can apply chg directly  to the skin and wash                       Gently with a scrungie or clean washcloth.  5.  Apply the CHG Soap to your body ONLY FROM THE NECK DOWN.   Do not use on face/ open                           Wound or open sores. Avoid contact with eyes, ears mouth and genitals (private parts).                       Wash face,  Genitals (private parts) with your normal soap.             6.  Wash thoroughly, paying special attention  to the area where your surgery  will be performed.  7.  Thoroughly rinse your body with warm water from the neck down.  8.  DO NOT shower/wash with your normal soap after using and rinsing off  the CHG Soap.             9.  Pat yourself dry with a clean towel.            10.  Wear clean pajamas.  11.  Place clean sheets on your bed the night of your first shower and do not  sleep with pets. Day of Surgery : Do not apply any lotions/deodorants the morning of surgery.  Please wear clean clothes to the hospital/surgery center.  FAILURE TO FOLLOW THESE INSTRUCTIONS MAY RESULT IN THE CANCELLATION OF YOUR SURGERY PATIENT SIGNATURE_________________________________  NURSE SIGNATURE__________________________________  ________________________________________________________________________

## 2021-04-02 NOTE — H&P (Signed)
CC/HPI: cc: Urethral stricture/penile lesions    03/19/21: 87 year old man with a history of prostate cancer Gleason 4+4=8 diagnosed Novmeber 2017 s/p XRT April 2018 and short course of Lupron last seen at [facility] in May 2019 by Dr. Lovena Neighbours. At that time he was using Myrbetriq 25 mg daily. Prostate size noted to be 70 g on biopsy with a pretreatment PSA of 23. He comes in today for urgent work-in for straining and dribbling. He normally wears 2 diapers a day and has to get up to void several times at night. Beginning on Saturday he felt like his urethra was blocked near the end of his penis. He then was only able to empty his bladder by dribbling regularly. His PVR today is 49 mL. He was unable to give a sample because he is only dribbling into his diaper.   03/28/2021: 87 year old man with the above history returns for follow-up after Foley catheter was removed yesterday. He came in a day early because his Foley catheter became obstructed. His Foley catheter was removed and he has been voiding on his own. He did wake up 6 times to void last night but PVR is low today. In addition he has symptoms of urinary frequency, urgency and nocturia at baseline. He wears depends briefs.     ALLERGIES: No Allergies    MEDICATIONS: Levothyroxine Sodium 25 mcg tablet  Metoprolol Succinate  Tamsulosin Hcl 0.4 mg capsule  Lupron Depot 30 mg (4 month) syringe kit 30 mg IM Administered by: Lucita Ferrara  Metformin Er Gastric  Triamcinolone Acetonide 0.05 % ointment 1 gram Topical BID apply to urethral meatus twice a day     GU PSH: Cysto Dilate Stricture (M or F) - 03/19/2021 Cysto Uretero Lithotripsy - 2009 Cystoscopy Insert Stent - 2009 Locm 300-$RemoveBefor'399Mg'[id number]$ /Ml Iodine,1Ml - 2017 PLACE RT DEVICE/MARKER, PROS - 2018 Prostate Needle Biopsy - 2017 Vasectomy - 2009       PSH Notes: Cystoscopy With Ureteroscopy With Lithotripsy, Cystoscopy With Insertion Of Ureteral Stent Left, Surgery Of Male Genitalia  Vasectomy, Back Surgery, Hernia Repair, Ear Surgery   NON-GU PSH: Cataract Surgery.., Bilateral - about 2013 Hernia Repair - 2009 Middle Ear Surgery Procedure, Right - about 1967 Shoulder Arthroscopy/surgery, Right - 2017 Surgical Pathology, Gross And Microscopic Examination For Prostate Needle - 2017     GU PMH: History of urolithiasis - 03/27/2021, Nephrolithiasis, - 2014, Nephrolithiasis, - 2014 Meatal stenosis (other urethral stricture) - 03/27/2021, - 03/19/2021 Phimosis - 03/19/2021, Phimosis, - 2014 Prostate Cancer - 03/19/2021, (Stable), - 2019, - 2019, - 2018, - 2018, - 2017 Urinary Frequency - 2019 Urinary Urgency - 2019 BPH w/LUTS - 2017 Elevated PSA - 2017, Elevated prostate specific antigen (PSA), - 2014 Nocturia - 2017 Ureteral calculus, Calculus of ureter - 2014      PMH Notes:  1898-09-09 00:00:00 - Note: Normal 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  Has never drank.  Does not drink caffeine. Patient's occupation is/was Retired [employer]    REVIEW OF SYSTEMS:    GU Review Male:   Patient denies frequent urination, hard  to postpone urination, burning/ pain with urination, get up at night to urinate, leakage of urine, stream starts and stops, trouble starting your stream, have to strain to urinate , erection problems, and penile pain.  Gastrointestinal (Upper):   Patient denies nausea, vomiting, and indigestion/ heartburn.  Gastrointestinal (Lower):   Patient denies diarrhea and constipation.  Constitutional:   Patient denies fever, night sweats, weight  loss, and fatigue.  Skin:   Patient denies skin rash/ lesion and itching.  Eyes:   Patient denies blurred vision and double vision.  Ears/ Nose/ Throat:   Patient denies sore throat and sinus problems.  Hematologic/Lymphatic:   Patient denies swollen glands and easy bruising.  Cardiovascular:   Patient denies leg swelling and chest pains.  Respiratory:   Patient denies cough and shortness of breath.  Endocrine:   Patient denies excessive thirst.  Musculoskeletal:   Patient denies back pain and joint pain.  Neurological:   Patient denies headaches and dizziness.  Psychologic:   Patient denies depression and anxiety.   VITAL SIGNS: None   GU PHYSICAL EXAMINATION:      Notes: Retracted penis with tight redundant foreskin unable to clearly see meatus, unable to self dilate due to poor visualization   MULTI-SYSTEM PHYSICAL EXAMINATION:    Constitutional: Well-nourished. No physical deformities. Normally developed. Good grooming.  Neck: Neck symmetrical, not swollen. Normal tracheal position.  Respiratory: No labored breathing, no use of accessory muscles.   Skin: No paleness, no jaundice, no cyanosis. No lesion, no ulcer, no rash.  Neurologic / Psychiatric: Oriented to time, oriented to place, oriented to person. No depression, no anxiety, no agitation.  Eyes: Normal conjunctivae. Normal eyelids.  Ears, Nose, Mouth, and Throat: Left ear no scars, no lesions, no masses. Right ear no scars, no lesions, no masses. Nose no scars, no lesions, no masses. Normal hearing. Normal lips.  Musculoskeletal: Normal gait and station of head and neck.     Complexity of Data:  Records Review:   Previous Patient Records  Urodynamics Review:   Review Bladder Scan   04/16/17 12/12/16 05/31/16  PSA  Total PSA < 0.1 ng/dl 0.045 ng/dl 22.20   Free PSA < 0.1 ng/dl      04/16/17 12/12/16  Hormones  Testosterone, Total 15 pg/dL 24.5 pg/dL    PROCEDURES:         PVR Ultrasound - 51798  Scanned Volume: 37  cc   ASSESSMENT:      ICD-10 Details  1 GU:   Meatal stenosis (other urethral stricture) - N35.8 Chronic, Stable  2   Phimosis - N47.1 Chronic, Stable  3   Anterior urethral stricture - N35.013 Chronic, Stable   PLAN:            Medications New Meds: Macrobid 100 mg capsule 1 capsule PO BID   #10  0 Refill(s)            Document Letter(s):  Created for Patient: Clinical Summary         Notes:   Discussed proceeding with lysing penile adhesions around the glans to urethra as well as possible dorsal slit to expose the glans of the penis. He may also need repeat urethral balloon dilation. Given patient's urinary symptoms will also perform flexible cystoscopy at this time. This will be done in the operating room however under local anesthesia. The risks and benefits of the procedure were discussed with the patient his wife in detail. He understands he will need a Foley catheter after  the procedure. Will treat empirically with Macrobid and is cultures pending.

## 2021-04-03 ENCOUNTER — Ambulatory Visit (HOSPITAL_COMMUNITY)
Admission: RE | Admit: 2021-04-03 | Discharge: 2021-04-03 | Disposition: A | Discharge status: Home / Self Care | Payer: Medicare Other | Attending: Urology | Admitting: Urology

## 2021-04-03 ENCOUNTER — Ambulatory Visit (HOSPITAL_COMMUNITY): Payer: Medicare Other | Admitting: Certified Registered Nurse Anesthetist

## 2021-04-03 ENCOUNTER — Encounter (HOSPITAL_COMMUNITY): Payer: Self-pay | Admitting: Urology

## 2021-04-03 ENCOUNTER — Other Ambulatory Visit: Payer: Self-pay

## 2021-04-03 ENCOUNTER — Ambulatory Visit (HOSPITAL_COMMUNITY): Payer: Medicare Other | Admitting: Physician Assistant

## 2021-04-03 ENCOUNTER — Encounter (HOSPITAL_COMMUNITY)
Admission: RE | Disposition: A | Discharge status: Home / Self Care | Payer: Self-pay | Source: Home / Self Care | Attending: Urology

## 2021-04-03 DIAGNOSIS — Q5564 Hidden penis: Secondary | ICD-10-CM | POA: Insufficient documentation

## 2021-04-03 DIAGNOSIS — Z7984 Long term (current) use of oral hypoglycemic drugs: Secondary | ICD-10-CM | POA: Diagnosis not present

## 2021-04-03 DIAGNOSIS — Z79890 Hormone replacement therapy (postmenopausal): Secondary | ICD-10-CM | POA: Diagnosis not present

## 2021-04-03 DIAGNOSIS — N400 Enlarged prostate without lower urinary tract symptoms: Secondary | ICD-10-CM | POA: Insufficient documentation

## 2021-04-03 DIAGNOSIS — N4889 Other specified disorders of penis: Secondary | ICD-10-CM | POA: Diagnosis not present

## 2021-04-03 DIAGNOSIS — N35919 Unspecified urethral stricture, male, unspecified site: Secondary | ICD-10-CM | POA: Diagnosis present

## 2021-04-03 DIAGNOSIS — Z8546 Personal history of malignant neoplasm of prostate: Secondary | ICD-10-CM | POA: Diagnosis not present

## 2021-04-03 DIAGNOSIS — N471 Phimosis: Secondary | ICD-10-CM | POA: Diagnosis not present

## 2021-04-03 DIAGNOSIS — N35911 Unspecified urethral stricture, male, meatal: Secondary | ICD-10-CM

## 2021-04-03 DIAGNOSIS — Z923 Personal history of irradiation: Secondary | ICD-10-CM | POA: Insufficient documentation

## 2021-04-03 DIAGNOSIS — Z79899 Other long term (current) drug therapy: Secondary | ICD-10-CM | POA: Diagnosis not present

## 2021-04-03 HISTORY — PX: MEATOTOMY: SHX5133

## 2021-04-03 HISTORY — PX: DORSAL SLIT: SHX6822

## 2021-04-03 HISTORY — PX: CYSTOSCOPY WITH URETHRAL DILATATION: SHX5125

## 2021-04-03 LAB — GLUCOSE, CAPILLARY
Glucose-Capillary: 135 mg/dL — ABNORMAL HIGH (ref 70–99)
Glucose-Capillary: 139 mg/dL — ABNORMAL HIGH (ref 70–99)

## 2021-04-03 SURGERY — CYSTOSCOPY, WITH URETHRAL DILATION
Anesthesia: General | Site: Penis

## 2021-04-03 MED ORDER — LIDOCAINE HCL 1 % IJ SOLN
INTRAMUSCULAR | Status: DC | PRN
  Administered 2021-04-03: 30 mL

## 2021-04-03 MED ORDER — LIDOCAINE HCL URETHRAL/MUCOSAL 2 % EX GEL
CUTANEOUS | Status: AC
  Filled 2021-04-03: qty 30

## 2021-04-03 MED ORDER — FENTANYL CITRATE (PF) 100 MCG/2ML IJ SOLN
INTRAMUSCULAR | Status: DC | PRN
  Administered 2021-04-03 (×2): 50 ug via INTRAVENOUS

## 2021-04-03 MED ORDER — FENTANYL CITRATE (PF) 100 MCG/2ML IJ SOLN
INTRAMUSCULAR | Status: AC
  Filled 2021-04-03: qty 2

## 2021-04-03 MED ORDER — OXYCODONE HCL 5 MG PO TABS: 5.0000 mg | ORAL_TABLET | Freq: Once | ORAL | Status: DC | PRN

## 2021-04-03 MED ORDER — LACTATED RINGERS IV SOLN: INTRAVENOUS | Status: DC

## 2021-04-03 MED ORDER — PROPOFOL 10 MG/ML IV BOLUS
INTRAVENOUS | Status: AC
  Filled 2021-04-03: qty 20

## 2021-04-03 MED ORDER — OXYCODONE HCL 5 MG/5ML PO SOLN: 5.0000 mg | Freq: Once | ORAL | Status: DC | PRN

## 2021-04-03 MED ORDER — ONDANSETRON HCL 4 MG/2ML IJ SOLN
INTRAMUSCULAR | Status: DC | PRN
  Administered 2021-04-03: 4 mg via INTRAVENOUS

## 2021-04-03 MED ORDER — BUPIVACAINE HCL (PF) 0.25 % IJ SOLN
INTRAMUSCULAR | Status: DC | PRN
  Administered 2021-04-03: 30 mL

## 2021-04-03 MED ORDER — BUPIVACAINE HCL 0.25 % IJ SOLN
INTRAMUSCULAR | Status: AC
  Filled 2021-04-03: qty 1

## 2021-04-03 MED ORDER — DEXAMETHASONE SODIUM PHOSPHATE 4 MG/ML IJ SOLN
INTRAMUSCULAR | Status: DC | PRN
  Administered 2021-04-03: 10 mg via INTRAVENOUS

## 2021-04-03 MED ORDER — PROPOFOL 500 MG/50ML IV EMUL
INTRAVENOUS | Status: DC | PRN
  Administered 2021-04-03: 30 mg via INTRAVENOUS
  Administered 2021-04-03: 75 ug/kg/min via INTRAVENOUS
  Administered 2021-04-03: 30 mg via INTRAVENOUS

## 2021-04-03 MED ORDER — ACETAMINOPHEN 10 MG/ML IV SOLN: 1000.0000 mg | Freq: Once | INTRAVENOUS | Status: DC | PRN

## 2021-04-03 MED ORDER — CHLORHEXIDINE GLUCONATE 0.12 % MT SOLN
15.0000 mL | Freq: Once | OROMUCOSAL | Status: AC
  Administered 2021-04-03: 15 mL via OROMUCOSAL

## 2021-04-03 MED ORDER — MIDAZOLAM HCL 2 MG/2ML IJ SOLN
INTRAMUSCULAR | Status: AC
  Filled 2021-04-03: qty 2

## 2021-04-03 MED ORDER — MIDAZOLAM HCL 5 MG/5ML IJ SOLN
INTRAMUSCULAR | Status: DC | PRN
  Administered 2021-04-03: 1 mg via INTRAVENOUS

## 2021-04-03 MED ORDER — ORAL CARE MOUTH RINSE: 15.0000 mL | Freq: Once | OROMUCOSAL | Status: AC

## 2021-04-03 MED ORDER — CEPHALEXIN 250 MG PO CAPS: 250.0000 mg | ORAL_CAPSULE | Freq: Every day | ORAL | 0 refills | Status: AC

## 2021-04-03 MED ORDER — ONDANSETRON HCL 4 MG/2ML IJ SOLN: 4.0000 mg | Freq: Once | INTRAMUSCULAR | Status: DC | PRN

## 2021-04-03 MED ORDER — LIDOCAINE 2% (20 MG/ML) 5 ML SYRINGE
INTRAMUSCULAR | Status: AC
  Filled 2021-04-03: qty 5

## 2021-04-03 MED ORDER — PROPOFOL 500 MG/50ML IV EMUL
INTRAVENOUS | Status: AC
  Filled 2021-04-03: qty 50

## 2021-04-03 MED ORDER — DEXAMETHASONE SODIUM PHOSPHATE 10 MG/ML IJ SOLN
INTRAMUSCULAR | Status: AC
  Filled 2021-04-03: qty 1

## 2021-04-03 MED ORDER — SODIUM CHLORIDE 0.9 % IV SOLN
2.0000 g | INTRAVENOUS | Status: DC
  Filled 2021-04-03: qty 2

## 2021-04-03 MED ORDER — LIDOCAINE HCL (PF) 1 % IJ SOLN
INTRAMUSCULAR | Status: AC
  Filled 2021-04-03: qty 30

## 2021-04-03 MED ORDER — FENTANYL CITRATE (PF) 100 MCG/2ML IJ SOLN: 25.0000 ug | INTRAMUSCULAR | Status: DC | PRN

## 2021-04-03 MED ORDER — ONDANSETRON HCL 4 MG/2ML IJ SOLN
INTRAMUSCULAR | Status: AC
  Filled 2021-04-03: qty 2

## 2021-04-03 SURGICAL SUPPLY — 42 items
BAG COUNTER SPONGE SURGICOUNT (BAG) IMPLANT
BAG DRN RND TRDRP ANRFLXCHMBR (UROLOGICAL SUPPLIES) ×2
BAG SPNG CNTER NS LX DISP (BAG)
BAG URINE DRAIN 2000ML AR STRL (UROLOGICAL SUPPLIES) ×3 IMPLANT
BALLN NEPHROSTOMY (BALLOONS)
BALLOON NEPHROSTOMY (BALLOONS) IMPLANT
BLADE SURG 15 STRL LF DISP TIS (BLADE) ×2 IMPLANT
BLADE SURG 15 STRL SS (BLADE) ×3
BNDG COHESIVE 1X5 TAN STRL LF (GAUZE/BANDAGES/DRESSINGS) ×2 IMPLANT
CATH FOLEY 2W COUNCIL 20FR 5CC (CATHETERS) IMPLANT
CATH FOLEY 2W COUNCIL 5CC 16FR (CATHETERS) ×1 IMPLANT
CATH INTERMIT  6FR 70CM (CATHETERS) IMPLANT
CATH ROBINSON RED A/P 14FR (CATHETERS) ×2 IMPLANT
CATH URET 5FR 28IN CONE TIP (BALLOONS)
CATH URET 5FR 70CM CONE TIP (BALLOONS) IMPLANT
CLOTH BEACON ORANGE TIMEOUT ST (SAFETY) ×2 IMPLANT
COVER SURGICAL LIGHT HANDLE (MISCELLANEOUS) ×3 IMPLANT
DRAPE LAPAROTOMY T 98X78 PEDS (DRAPES) ×3 IMPLANT
ELECT REM PT RETURN 15FT ADLT (MISCELLANEOUS) ×3 IMPLANT
GAUZE SPONGE 4X4 12PLY STRL (GAUZE/BANDAGES/DRESSINGS) ×2 IMPLANT
GAUZE XEROFORM 1X8 LF (GAUZE/BANDAGES/DRESSINGS) ×3 IMPLANT
GLOVE SURG ENC MOIS LTX SZ6.5 (GLOVE) ×3 IMPLANT
GLOVE SURG ENC MOIS LTX SZ7.5 (GLOVE) ×3 IMPLANT
GOWN STRL REUS W/TWL LRG LVL3 (GOWN DISPOSABLE) ×3 IMPLANT
GUIDEWIRE ANG ZIPWIRE 038X150 (WIRE) IMPLANT
GUIDEWIRE STR DUAL SENSOR (WIRE) ×3 IMPLANT
KIT BASIN OR (CUSTOM PROCEDURE TRAY) ×3 IMPLANT
KIT TURNOVER KIT A (KITS) ×3 IMPLANT
MANIFOLD NEPTUNE II (INSTRUMENTS) IMPLANT
NEEDLE HYPO 22GX1.5 SAFETY (NEEDLE) ×3 IMPLANT
NS IRRIG 1000ML POUR BTL (IV SOLUTION) IMPLANT
PACK BASIC VI WITH GOWN DISP (CUSTOM PROCEDURE TRAY) ×3 IMPLANT
PACK CYSTO (CUSTOM PROCEDURE TRAY) ×2 IMPLANT
PENCIL SMOKE EVACUATOR (MISCELLANEOUS) IMPLANT
SUT CHROMIC 3 0 SH 27 (SUTURE) ×6 IMPLANT
SUT CHROMIC 4 0 SH 27 (SUTURE) ×2 IMPLANT
SUT VIC AB 4-0 PS2 18 (SUTURE) ×2 IMPLANT
SYR BULB EAR ULCER 3OZ GRN STR (SYRINGE) ×3 IMPLANT
SYR CONTROL 10ML LL (SYRINGE) ×1 IMPLANT
TOWEL OR 17X26 10 PK STRL BLUE (TOWEL DISPOSABLE) ×3 IMPLANT
TOWEL OR NON WOVEN STRL DISP B (DISPOSABLE) ×3 IMPLANT
WATER STERILE IRR 3000ML UROMA (IV SOLUTION) ×3 IMPLANT

## 2021-04-03 NOTE — Op Note (Signed)
Operative Note  Preoperative diagnosis:  1.  Phimosis/buried penis 2.  Meatal stenosis  Postoperative diagnosis: 1.  Phimosis/buried penis  Procedure(s): 1.  Dorsal slit 2.  Ventral slit 3.  Excision of penile adhesions 4.  Cystoscopy with foley placement over wire  Surgeon: Jacalyn Lefevre, MD  Intraoperative consult: Mingo Amber, MD  Assistants: None  Anesthesia: sedation and local  Complications: None immediate  EBL: 20 cc  Specimens: 1.  none  Drains/Catheters: 1.  16Fr council tip foley  Intraoperative findings:  Severe phimosis No evidence of urethral stricture Trilobar prostatic hypertrophy 16Fr council tip foley Bilateral orthotropic ureteral orifices, no evidence of bladder masses/stones   Indication: 87 yo male with phimosis and buried penis who recently presented with urinary retention secondary to foreskin adherent to urethral meatus.  He underwent cystoscopy with urethral balloon dilation over wire but meatus was not visualized at that time.   Description of procedure:  The patient was identified and consent was obtained.  The patient was taken to the operating room and placed in the supine position.  The patient was placed under MAC anesthesia.  Perioperative antibiotics were administered.  The patient was placed in supine position.  Patient was prepped and draped in a standard sterile fashion and a timeout was performed.  Perioperative antibiotics were administered.  A penile block and ring block was then performed.   The foreskin was phimotic and unable to be reduced.  A dorsal slit and ventral slit were then made sharply.  Foreskin adhesions were released with a hemostat.  Due to severe inflammation much of the skin on the penis and glans was no longer viable.  The penis was then degloved.    Due to lack of viable penile skin, an intraoperative plastic surgery consult was then called.  Plastic surgery will dictate his portion of the case.  A dressing  of Xeroform gauze and Coban was then placed on the penis.  The case was then turned back over to urology.  A 17 French flexible cystoscope was placed in the urethral meatus and advanced in the bladder and direct visualization.  Cystoscopic findings were normal.  A 0.38 sensor wire was placed through the cystoscope.  The cystoscope was removed.  A 16 French council tip Foley was then placed over the wire and inflated with 10 cc sterile water.  The patient emerged from anesthesia and was transferred the PACU in stable condition.  Plan: Patient will return in 1 week for a postoperative check and void trial.

## 2021-04-03 NOTE — Brief Op Note (Signed)
04/03/2021  4:12 PM  PATIENT:  Craig Jensen  87 y.o. male  PRE-OPERATIVE DIAGNOSIS:  URETHRAL STRICTURE, MEATAL STENOSIS, PENILE ADHESIONS  POST-OPERATIVE DIAGNOSIS:  URETHRAL STRICTURE, MEATAL STENOSIS, PENILE ADHESIONS  PROCEDURE:  Procedure(s) with comments: FLEXIBLE CYSTOSCOPY WITH URETHRAL DILATATION (N/A) - 90 MINS MEATOPLASTY ADULT (N/A) DORSAL SLIT (N/A)  SURGEON:  Surgeon(s) and Role:    * Jazell Rosenau, Johny Chess, MD - Primary    * Dahiana Kulak, Steffanie Dunn, MD  PHYSICIAN ASSISTANT: Jacalyn Lefevre, MD  ASSISTANTS: none   ANESTHESIA:   general  EBL:  10   BLOOD ADMINISTERED:none  DRAINS: none   LOCAL MEDICATIONS USED:  MARCAINE     SPECIMEN:  No Specimen  DISPOSITION OF SPECIMEN:  PATHOLOGY  COUNTS:  YES  TOURNIQUET:  * No tourniquets in log *  DICTATION: .Dragon Dictation  PLAN OF CARE: Discharge to home after PACU  PATIENT DISPOSITION:  PACU - hemodynamically stable.   Delay start of Pharmacological VTE agent (>24hrs) due to surgical blood loss or risk of bleeding: not applicable

## 2021-04-03 NOTE — Anesthesia Postprocedure Evaluation (Signed)
Anesthesia Post Note  Patient: Craig Jensen  Procedure(s) Performed: FLEXIBLE CYSTOSCOPY WITH URETHRAL DILATATION (Bladder) MEATOPLASTY ADULT (Penis) DORSAL SLIT (Penis)     Patient location during evaluation: PACU Anesthesia Type: MAC Level of consciousness: awake and alert Pain management: pain level controlled Vital Signs Assessment: post-procedure vital signs reviewed and stable Respiratory status: spontaneous breathing, nonlabored ventilation, respiratory function stable and patient connected to nasal cannula oxygen Cardiovascular status: stable and blood pressure returned to baseline Postop Assessment: no apparent nausea or vomiting Anesthetic complications: no   No notable events documented.  Last Vitals:  Vitals:   04/03/21 1300 04/03/21 1621  BP: (!) 190/89 (!) 196/83  Pulse:  (!) 51  Resp:  16  Temp:  (!) 36.4 C  SpO2:  100%    Last Pain:  Vitals:   04/03/21 1621  TempSrc:   PainSc: 0-No pain                 Riham Polyakov S

## 2021-04-03 NOTE — Transfer of Care (Signed)
Immediate Anesthesia Transfer of Care Note  Patient: Craig Jensen  Procedure(s) Performed: Procedure(s) with comments: FLEXIBLE CYSTOSCOPY WITH URETHRAL DILATATION (N/A) - 90 MINS MEATOPLASTY ADULT (N/A) DORSAL SLIT (N/A)  Patient Location: PACU   Anesthesia Type:MAC  Level of Consciousness: awake, alert  and oriented  Airway & Oxygen Therapy: Patient Spontanous Breathing and Patient connected to nasal cannula oxygen  Post-op Assessment: Report given to RN and Post -op Vital signs reviewed and stable  Post vital signs: Reviewed and stable  Last Vitals:  Vitals:   04/03/21 1244 04/03/21 1300  BP: (!) 197/86 (!) 190/89  Pulse: 60   Resp: 18   Temp: 36.7 C   SpO2: 77%     Complications: No apparent anesthesia complications

## 2021-04-03 NOTE — Interval H&P Note (Signed)
History and Physical Interval Note:  04/03/2021 1:29 PM  Craig Jensen  has presented today for surgery, with the diagnosis of URETHRAL STRICTURE, MEATAL STENOSIS, PENILE ADHESIONS.  The various methods of treatment have been discussed with the patient and family. After consideration of risks, benefits and other options for treatment, the patient has consented to  Procedure(s) with comments: [place] (N/A) - 90 MINS MEATOPLASTY ADULT (N/A) DORSAL SLIT (N/A) as a surgical intervention.  The patient's history has been reviewed, patient examined, no change in status, stable for surgery.  I have reviewed the patient's chart and labs.  Questions were answered to the patient's satisfaction.     Craig Jensen [name]

## 2021-04-03 NOTE — Op Note (Signed)
Operative Note   DATE OF OPERATION: 04/03/2021  SURGICAL DEPARTMENT: Plastic Surgery  PREOPERATIVE DIAGNOSES: Penile skin defect  POSTOPERATIVE DIAGNOSES:  same  PROCEDURE: Closure of penile skin defect with adjacent tissue transfer totaling 8 x 6 cm including the primary and secondary defect  SURGEON: Talmadge Coventry, MD  ASSISTANT: Arnette Schaumann, MD The MD assisted throughout the case.  The MD was essential in retraction and counter traction when needed to make the case progress smoothly.  This retraction and assistance made it possible to see the tissue plans for the procedure.  The assistance was needed for blood control, tissue re-approximation and assisted with closure of the incision site.  ANESTHESIA: MAC with local  COMPLICATIONS: None.   INDICATIONS FOR PROCEDURE:  The patient, Craig Jensen is a 87 y.o. male born on 1929/09/28, is here for treatment of skin defect after retrieval of buried penis.  I was called for an intraoperative consult for penile skin defect.  Patient had a buried penis and difficulty urinating and so the penis was going to be retrieved however there was not enough skin coverage to get the wound closed.  I was called to help with wound closure in the operating room. MRN: [id number]  DESCRIPTION OF PROCEDURE:  Arrived to the operating room with the patient sedated under MAC anesthesia.  Penis had been largely degloved in the process of its retrieval.  The glans was exposed and a dorsal and ventral slit had been used to dissected out.  We continued the dorsal slit to what appeared to be near the base of the penis.  There was then significant defect ventrally and dorsally.  We elected to release the skin on either side and bringing around ventrally to get good soft tissue coverage in that area.  That was able to give the entirety of the ventral surface good coverage and everything was secured with 4-0 Vicryl sutures.  This left a 3 x 3 cm approximate  defect dorsally.  At this point I elected to do a transposition flap from the suprapubic skin to get this covered during this procedure.  I infiltrated local anesthetic in that area and did a transitions flap designed with its base to the patient's left side.  This was elevated in subcutaneous plane with a 15 blade and rotated into position.  It gave good coverage.  Donor site was closed with interrupted buried 4-0 Vicryl sutures in the flap was inset along the coronal ridge and laterally with 4-0 Vicryl sutures.  Total area of tissue rearrangement was approximately 8 x 6 cm including the primary and secondary defect.  This gave nice durable soft tissue coverage throughout.  The patient tolerated the procedure well.  There were no complications. The patient was allowed to wake from anesthesia, extubated and taken to the recovery room in satisfactory condition.

## 2021-04-03 NOTE — Discharge Instructions (Signed)
Postoperative instructions for circumcision ° °Wound: ° °In most cases your incision will have absorbable sutures that run along the course of your incision and will dissolve within the first 10-20 days. Some will fall out even earlier. Expect some redness as the sutures dissolved but this should occur only around the sutures. If there is generalized redness, especially with increasing pain or swelling, let us know. The penis will very likely get "black and blue" as the blood in the tissues spread. Sometimes the whole penis will turn colors. The black and blue is followed by a yellow and brown color. In time, all the discoloration will go away. ° °Diet: ° °You may return to your normal diet within 24 hours following your surgery. You may note some mild nausea and possibly vomiting the first 6-8 hours following surgery. This is usually due to the side effects of anesthesia, and will disappear quite soon. I would suggest clear liquids and a very light meal the first evening following your surgery. ° °Activity: ° °Your physical activity should be restricted the first 48 hours. During that time you should remain relatively inactive, moving about only when necessary. During the first 7-10 days following surgery he should avoid lifting any heavy objects (anything greater than 15 pounds), and avoid strenuous exercise. If you work, ask us specifically about your restrictions, both for work and home. We will write a note to your employer if needed. ° °Ice packs can be placed on and off over the penis for the first 48 hours to help relieve the pain and keep the swelling down. Frozen peas or corn in a ZipLock bag can be frozen, used and re-frozen. Fifteen minutes on and 15 minutes off is a reasonable schedule.  ° °Hygiene: ° °You may shower 48 hours after your surgery. Tub bathing should be restricted until the seventh day. ° °Medication: ° °You will be sent home with some type of pain medication. In many cases you will be  sent home with a narcotic pain pill (Vicodin or Tylox). If the pain is not too bad, you may take either Tylenol (acetaminophen) or Advil (ibuprofen) which contain no narcotic agents, and might be tolerated a little better, with fewer side effects. If the pain medication you are sent home with does not control the pain, you will have to let us know. Some narcotic pain medications cannot be given or refilled by a phone call to a pharmacy. ° °Problems you should report to us: ° °· Fever of 101.0 degrees Fahrenheit or greater. °· Moderate or severe swelling under the skin incision or involving the scrotum. °· Drug reaction such as hives, a rash, nausea or vomiting.  ° ° °

## 2021-04-03 NOTE — Anesthesia Preprocedure Evaluation (Addendum)
Anesthesia Evaluation  Patient identified by MRN, date of birth, ID band Patient awake    Reviewed: Allergy & Precautions, NPO status , Patient's Chart, lab work & pertinent test results  Airway Mallampati: II  TM Distance: >3 FB Neck ROM: Full    Dental no notable dental hx.    Pulmonary neg pulmonary ROS,    Pulmonary exam normal breath sounds clear to auscultation       Cardiovascular hypertension, Normal cardiovascular exam Rhythm:Regular Rate:Normal     Neuro/Psych negative neurological ROS  negative psych ROS   GI/Hepatic negative GI ROS, Neg liver ROS,   Endo/Other  diabetesHypothyroidism   Renal/GU negative Renal ROS  negative genitourinary   Musculoskeletal negative musculoskeletal ROS (+)   Abdominal   Peds negative pediatric ROS (+)  Hematology negative hematology ROS (+)   Anesthesia Other Findings   Reproductive/Obstetrics negative OB ROS                             Anesthesia Physical Anesthesia Plan  ASA: 2  Anesthesia Plan: MAC   Post-op Pain Management:    Induction: Intravenous  PONV Risk Score and Plan: 2 and Ondansetron, Dexamethasone and Treatment may vary due to age or medical condition  Airway Management Planned: Simple Face Mask  Additional Equipment:   Intra-op Plan:   Post-operative Plan: Extubation in OR  Informed Consent: I have reviewed the patients History and Physical, chart, labs and discussed the procedure including the risks, benefits and alternatives for the proposed anesthesia with the patient or authorized representative who has indicated his/her understanding and acceptance.     Dental advisory given  Plan Discussed with: CRNA and Surgeon  Anesthesia Plan Comments:        Anesthesia Quick Evaluation

## 2021-04-04 ENCOUNTER — Encounter (HOSPITAL_COMMUNITY): Payer: Self-pay | Admitting: Urology

## 2021-05-22 ENCOUNTER — Emergency Department (HOSPITAL_BASED_OUTPATIENT_CLINIC_OR_DEPARTMENT_OTHER): Payer: Medicare Other

## 2021-05-22 ENCOUNTER — Inpatient Hospital Stay (HOSPITAL_BASED_OUTPATIENT_CLINIC_OR_DEPARTMENT_OTHER)
Admission: EM | Admit: 2021-05-22 | Discharge: 2021-05-27 | DRG: 872 | Disposition: A | Discharge status: Home Health | Payer: Medicare Other | Attending: Internal Medicine | Admitting: Internal Medicine

## 2021-05-22 ENCOUNTER — Other Ambulatory Visit: Payer: Self-pay

## 2021-05-22 ENCOUNTER — Encounter (HOSPITAL_BASED_OUTPATIENT_CLINIC_OR_DEPARTMENT_OTHER): Payer: Self-pay | Admitting: Urology

## 2021-05-22 DIAGNOSIS — Z20822 Contact with and (suspected) exposure to covid-19: Secondary | ICD-10-CM | POA: Diagnosis present

## 2021-05-22 DIAGNOSIS — Z87442 Personal history of urinary calculi: Secondary | ICD-10-CM | POA: Diagnosis not present

## 2021-05-22 DIAGNOSIS — R7881 Bacteremia: Secondary | ICD-10-CM | POA: Diagnosis not present

## 2021-05-22 DIAGNOSIS — L8990 Pressure ulcer of unspecified site, unspecified stage: Secondary | ICD-10-CM | POA: Insufficient documentation

## 2021-05-22 DIAGNOSIS — E7849 Other hyperlipidemia: Secondary | ICD-10-CM | POA: Diagnosis present

## 2021-05-22 DIAGNOSIS — A4159 Other Gram-negative sepsis: Principal | ICD-10-CM | POA: Diagnosis present

## 2021-05-22 DIAGNOSIS — A419 Sepsis, unspecified organism: Secondary | ICD-10-CM | POA: Diagnosis not present

## 2021-05-22 DIAGNOSIS — E119 Type 2 diabetes mellitus without complications: Secondary | ICD-10-CM

## 2021-05-22 DIAGNOSIS — E1142 Type 2 diabetes mellitus with diabetic polyneuropathy: Secondary | ICD-10-CM | POA: Diagnosis present

## 2021-05-22 DIAGNOSIS — L89302 Pressure ulcer of unspecified buttock, stage 2: Secondary | ICD-10-CM | POA: Diagnosis present

## 2021-05-22 DIAGNOSIS — I10 Essential (primary) hypertension: Secondary | ICD-10-CM | POA: Diagnosis present

## 2021-05-22 DIAGNOSIS — E876 Hypokalemia: Secondary | ICD-10-CM | POA: Diagnosis present

## 2021-05-22 DIAGNOSIS — Z23 Encounter for immunization: Secondary | ICD-10-CM

## 2021-05-22 DIAGNOSIS — F411 Generalized anxiety disorder: Secondary | ICD-10-CM | POA: Diagnosis present

## 2021-05-22 DIAGNOSIS — Z9842 Cataract extraction status, left eye: Secondary | ICD-10-CM | POA: Diagnosis not present

## 2021-05-22 DIAGNOSIS — B9689 Other specified bacterial agents as the cause of diseases classified elsewhere: Secondary | ICD-10-CM | POA: Diagnosis present

## 2021-05-22 DIAGNOSIS — E039 Hypothyroidism, unspecified: Secondary | ICD-10-CM | POA: Diagnosis present

## 2021-05-22 DIAGNOSIS — E860 Dehydration: Secondary | ICD-10-CM | POA: Diagnosis present

## 2021-05-22 DIAGNOSIS — Z961 Presence of intraocular lens: Secondary | ICD-10-CM | POA: Diagnosis present

## 2021-05-22 DIAGNOSIS — B961 Klebsiella pneumoniae [K. pneumoniae] as the cause of diseases classified elsewhere: Secondary | ICD-10-CM | POA: Diagnosis present

## 2021-05-22 DIAGNOSIS — Z8249 Family history of ischemic heart disease and other diseases of the circulatory system: Secondary | ICD-10-CM

## 2021-05-22 DIAGNOSIS — G629 Polyneuropathy, unspecified: Secondary | ICD-10-CM

## 2021-05-22 DIAGNOSIS — N3001 Acute cystitis with hematuria: Secondary | ICD-10-CM | POA: Diagnosis present

## 2021-05-22 DIAGNOSIS — Z79899 Other long term (current) drug therapy: Secondary | ICD-10-CM

## 2021-05-22 DIAGNOSIS — Z9841 Cataract extraction status, right eye: Secondary | ICD-10-CM | POA: Diagnosis not present

## 2021-05-22 DIAGNOSIS — Z87440 Personal history of urinary (tract) infections: Secondary | ICD-10-CM

## 2021-05-22 DIAGNOSIS — Z1611 Resistance to penicillins: Secondary | ICD-10-CM | POA: Diagnosis present

## 2021-05-22 DIAGNOSIS — Z8546 Personal history of malignant neoplasm of prostate: Secondary | ICD-10-CM | POA: Diagnosis not present

## 2021-05-22 DIAGNOSIS — N390 Urinary tract infection, site not specified: Secondary | ICD-10-CM | POA: Diagnosis not present

## 2021-05-22 DIAGNOSIS — Z79890 Hormone replacement therapy (postmenopausal): Secondary | ICD-10-CM

## 2021-05-22 DIAGNOSIS — Z7984 Long term (current) use of oral hypoglycemic drugs: Secondary | ICD-10-CM

## 2021-05-22 DIAGNOSIS — C61 Malignant neoplasm of prostate: Secondary | ICD-10-CM | POA: Diagnosis not present

## 2021-05-22 DIAGNOSIS — R059 Cough, unspecified: Secondary | ICD-10-CM

## 2021-05-22 DIAGNOSIS — R531 Weakness: Secondary | ICD-10-CM | POA: Diagnosis not present

## 2021-05-22 DIAGNOSIS — R5381 Other malaise: Secondary | ICD-10-CM | POA: Diagnosis present

## 2021-05-22 LAB — CBC
HCT: 33.3 % — ABNORMAL LOW (ref 39.0–52.0)
Hemoglobin: 11.3 g/dL — ABNORMAL LOW (ref 13.0–17.0)
MCH: 31.1 pg (ref 26.0–34.0)
MCHC: 33.9 g/dL (ref 30.0–36.0)
MCV: 91.7 fL (ref 80.0–100.0)
Platelets: 220 10*3/uL (ref 150–400)
RBC: 3.63 MIL/uL — ABNORMAL LOW (ref 4.22–5.81)
RDW: 13.2 % (ref 11.5–15.5)
WBC: 20.6 10*3/uL — ABNORMAL HIGH (ref 4.0–10.5)
nRBC: 0 % (ref 0.0–0.2)

## 2021-05-22 LAB — URINALYSIS, ROUTINE W REFLEX MICROSCOPIC
Bilirubin Urine: NEGATIVE
Glucose, UA: NEGATIVE mg/dL
Ketones, ur: NEGATIVE mg/dL
Nitrite: POSITIVE — AB
Protein, ur: 30 mg/dL — AB
Specific Gravity, Urine: 1.02 (ref 1.005–1.030)
pH: 6 (ref 5.0–8.0)

## 2021-05-22 LAB — COMPREHENSIVE METABOLIC PANEL
ALT: 16 U/L (ref 0–44)
AST: 18 U/L (ref 15–41)
Albumin: 3.3 g/dL — ABNORMAL LOW (ref 3.5–5.0)
Alkaline Phosphatase: 67 U/L (ref 38–126)
Anion gap: 10 (ref 5–15)
BUN: 23 mg/dL (ref 8–23)
CO2: 23 mmol/L (ref 22–32)
Calcium: 8.7 mg/dL — ABNORMAL LOW (ref 8.9–10.3)
Chloride: 102 mmol/L (ref 98–111)
Creatinine, Ser: 1.3 mg/dL — ABNORMAL HIGH (ref 0.61–1.24)
GFR, Estimated: 53 mL/min — ABNORMAL LOW (ref >60)
Glucose, Bld: 204 mg/dL — ABNORMAL HIGH (ref 70–99)
Potassium: 4 mmol/L (ref 3.5–5.1)
Sodium: 135 mmol/L (ref 135–145)
Total Bilirubin: 0.9 mg/dL (ref 0.3–1.2)
Total Protein: 7 g/dL (ref 6.5–8.1)

## 2021-05-22 LAB — CREATININE, SERUM
Creatinine, Ser: 0.95 mg/dL (ref 0.61–1.24)
GFR, Estimated: >60 mL/min (ref >60)

## 2021-05-22 LAB — URINALYSIS, MICROSCOPIC (REFLEX): WBC, UA: >50 WBC/hpf (ref 0–5)

## 2021-05-22 LAB — CBC WITH DIFFERENTIAL/PLATELET
Abs Immature Granulocytes: 0.14 10*3/uL — ABNORMAL HIGH (ref 0.00–0.07)
Basophils Absolute: 0.1 10*3/uL (ref 0.0–0.1)
Basophils Relative: 0 %
Eosinophils Absolute: 0 10*3/uL (ref 0.0–0.5)
Eosinophils Relative: 0 %
HCT: 38.5 % — ABNORMAL LOW (ref 39.0–52.0)
Hemoglobin: 12.8 g/dL — ABNORMAL LOW (ref 13.0–17.0)
Immature Granulocytes: 1 %
Lymphocytes Relative: 8 %
Lymphs Abs: 1.8 10*3/uL (ref 0.7–4.0)
MCH: 31 pg (ref 26.0–34.0)
MCHC: 33.2 g/dL (ref 30.0–36.0)
MCV: 93.2 fL (ref 80.0–100.0)
Monocytes Absolute: 2.3 10*3/uL — ABNORMAL HIGH (ref 0.1–1.0)
Monocytes Relative: 10 %
Neutro Abs: 18.5 10*3/uL — ABNORMAL HIGH (ref 1.7–7.7)
Neutrophils Relative %: 81 %
Platelets: 290 10*3/uL (ref 150–400)
RBC: 4.13 MIL/uL — ABNORMAL LOW (ref 4.22–5.81)
RDW: 13.1 % (ref 11.5–15.5)
WBC: 22.9 10*3/uL — ABNORMAL HIGH (ref 4.0–10.5)
nRBC: 0 % (ref 0.0–0.2)

## 2021-05-22 LAB — LACTIC ACID, PLASMA
Lactic Acid, Venous: 2.1 mmol/L (ref 0.5–1.9)
Lactic Acid, Venous: 2 mmol/L (ref 0.5–1.9)

## 2021-05-22 LAB — RESP PANEL BY RT-PCR (FLU A&B, COVID) ARPGX2
Influenza A by PCR: NEGATIVE
Influenza B by PCR: NEGATIVE
SARS Coronavirus 2 by RT PCR: NEGATIVE

## 2021-05-22 LAB — GLUCOSE, CAPILLARY: Glucose-Capillary: 201 mg/dL — ABNORMAL HIGH (ref 70–99)

## 2021-05-22 LAB — CBG MONITORING, ED: Glucose-Capillary: 159 mg/dL — ABNORMAL HIGH (ref 70–99)

## 2021-05-22 MED ORDER — ONDANSETRON HCL 4 MG PO TABS: 4.0000 mg | ORAL_TABLET | Freq: Four times a day (QID) | ORAL | Status: DC | PRN

## 2021-05-22 MED ORDER — LACTATED RINGERS IV BOLUS (SEPSIS)
1000.0000 mL | Freq: Once | INTRAVENOUS | Status: AC
  Administered 2021-05-22: 1000 mL via INTRAVENOUS

## 2021-05-22 MED ORDER — INFLUENZA VAC A&B SA ADJ QUAD 0.5 ML IM PRSY
0.5000 mL | PREFILLED_SYRINGE | INTRAMUSCULAR | Status: AC
  Administered 2021-05-27: 0.5 mL via INTRAMUSCULAR
  Filled 2021-05-22 (×2): qty 0.5

## 2021-05-22 MED ORDER — SODIUM CHLORIDE 0.9 % IV SOLN
2.0000 g | INTRAVENOUS | Status: DC
  Administered 2021-05-23 – 2021-05-24 (×2): 2 g via INTRAVENOUS
  Filled 2021-05-22: qty 2
  Filled 2021-05-22: qty 20
  Filled 2021-05-22: qty 2

## 2021-05-22 MED ORDER — INSULIN ASPART 100 UNIT/ML IJ SOLN
0.0000 [IU] | Freq: Three times a day (TID) | INTRAMUSCULAR | Status: DC
  Administered 2021-05-23: 2 [IU] via SUBCUTANEOUS
  Administered 2021-05-23: 5 [IU] via SUBCUTANEOUS
  Administered 2021-05-23 – 2021-05-24 (×3): 3 [IU] via SUBCUTANEOUS
  Administered 2021-05-24: 5 [IU] via SUBCUTANEOUS
  Administered 2021-05-25 – 2021-05-26 (×4): 3 [IU] via SUBCUTANEOUS
  Administered 2021-05-26: 2 [IU] via SUBCUTANEOUS
  Administered 2021-05-26 – 2021-05-27 (×2): 3 [IU] via SUBCUTANEOUS
  Administered 2021-05-27: 5 [IU] via SUBCUTANEOUS

## 2021-05-22 MED ORDER — ACETAMINOPHEN 500 MG PO TABS
1000.0000 mg | ORAL_TABLET | Freq: Once | ORAL | Status: AC
  Administered 2021-05-22: 1000 mg via ORAL
  Filled 2021-05-22: qty 2

## 2021-05-22 MED ORDER — LACTATED RINGERS IV SOLN: INTRAVENOUS | Status: AC

## 2021-05-22 MED ORDER — LACTATED RINGERS IV BOLUS (SEPSIS)
500.0000 mL | Freq: Once | INTRAVENOUS | Status: AC
  Administered 2021-05-22: 500 mL via INTRAVENOUS

## 2021-05-22 MED ORDER — ACETAMINOPHEN 325 MG PO TABS
650.0000 mg | ORAL_TABLET | Freq: Four times a day (QID) | ORAL | Status: DC | PRN
  Administered 2021-05-22 – 2021-05-25 (×3): 650 mg via ORAL
  Filled 2021-05-22 (×3): qty 2

## 2021-05-22 MED ORDER — ONDANSETRON HCL 4 MG/2ML IJ SOLN: 4.0000 mg | Freq: Four times a day (QID) | INTRAMUSCULAR | Status: DC | PRN

## 2021-05-22 MED ORDER — INSULIN ASPART 100 UNIT/ML IJ SOLN
0.0000 [IU] | Freq: Every day | INTRAMUSCULAR | Status: DC
  Administered 2021-05-22: 2 [IU] via SUBCUTANEOUS

## 2021-05-22 MED ORDER — ENOXAPARIN SODIUM 40 MG/0.4ML IJ SOSY
40.0000 mg | PREFILLED_SYRINGE | INTRAMUSCULAR | Status: DC
  Administered 2021-05-22: 40 mg via SUBCUTANEOUS
  Filled 2021-05-22: qty 0.4

## 2021-05-22 MED ORDER — LACTATED RINGERS IV SOLN: INTRAVENOUS | Status: DC

## 2021-05-22 MED ORDER — ACETAMINOPHEN 650 MG RE SUPP: 650.0000 mg | Freq: Four times a day (QID) | RECTAL | Status: DC | PRN

## 2021-05-22 MED ORDER — CEFTRIAXONE SODIUM 2 G IJ SOLR
2.0000 g | INTRAMUSCULAR | Status: DC
Start: 2021-05-22 — End: 2021-05-22
  Administered 2021-05-22: 2 g via INTRAVENOUS
  Filled 2021-05-22: qty 20

## 2021-05-22 NOTE — ED Notes (Signed)
ED Provider at bedside. 

## 2021-05-22 NOTE — Sepsis Progress Note (Signed)
Code sepsis protocol being monitored by eLink. 

## 2021-05-22 NOTE — ED Notes (Signed)
Placed on o2 2L [place]

## 2021-05-22 NOTE — ED Notes (Signed)
Pt has increased work of breathing noted, respiratory at bedside.  Spo2 wnl.  Temp up to 101.  Provider made aware

## 2021-05-22 NOTE — ED Triage Notes (Signed)
Bilateral states bilateral lower leg weakness x 2 days " my legs wont hold me up".  States fever of 103 at home last night.  H/o UTI in past month.

## 2021-05-22 NOTE — ED Notes (Signed)
RN asked RT to come assess patient due to some increased WOB. When I arrived, SAT had come back up to 96% on RA. Patient febrile and shivering. Patient repositioned and to be treated for fever. MD at bedside and agrees with plan.

## 2021-05-22 NOTE — ED Notes (Signed)
Report called to [facility] at [facility].

## 2021-05-22 NOTE — ED Notes (Signed)
FSBS 159

## 2021-05-22 NOTE — ED Provider Notes (Signed)
[facility] EMERGENCY DEPARTMENT Provider Note   CSN: [id number] Arrival date & time: 05/22/21  0802     History Chief Complaint  Patient presents with   Extremity Weakness    Craig Jensen is a 87 y.o. male.  HPI  87 year old male with past medical history of HTN, HLD, DM presents the emergency department with lower extremity weakness.  Patient states for the past 2 days he has felt generally weak in his bilateral lower extremities, to the point now when he stands up he feels like his legs cramp and give out.  Starting 2 days ago patient has been having a fever at home, T-max of 103.  This is been accompanied by decrease in appetite, mild nausea and nonproductive cough.  Patient has significant urinary history involving the prostate as well as multiple urinary tract infections.  His last UTI was 3 months ago.  Patient denies any chest pain or abdominal pain.  Past Medical History:  Diagnosis Date   Arthritis    Cataract    Diabetes mellitus without complication ([place])    History of kidney stones    Hyperlipidemia    Hypertension    Hypothyroidism    Inguinal hernia    Peripheral neuropathy    Prostate cancer ([place])    Prostate cancer [facility])     Patient Active Problem List   Diagnosis Date Noted   Colitis 01/11/2017   Hypokalemia 01/11/2017   Malignant neoplasm of prostate ([place]) 08/14/2016   S/p reverse total shoulder arthroplasty 01/11/2016    Past Surgical History:  Procedure Laterality Date   COLONOSCOPY     CYSTOSCOPY WITH URETHRAL DILATATION N/A 04/03/2021   Procedure: FLEXIBLE CYSTOSCOPY WITH URETHRAL DILATATION;  Surgeon: Robley Fries, MD;  Location: [facility];  Service: Urology;  Laterality: N/A;  90 MINS   DORSAL SLIT N/A 04/03/2021   Procedure: DORSAL SLIT;  Surgeon: Robley Fries, MD;  Location: [facility];  Service: Urology;  Laterality: N/A;   EYE SURGERY Bilateral    cataract surgery w/ lens implant   HERNIA REPAIR Left    inguinal hernia    INNER EAR SURGERY Right    age 55   KIDNEY STONE SURGERY     MEATOTOMY N/A 04/03/2021   Procedure: MEATOPLASTY ADULT;  Surgeon: Robley Fries, MD;  Location: [facility];  Service: Urology;  Laterality: N/A;   PROSTATE BIOPSY     REVERSE SHOULDER ARTHROPLASTY Right 01/11/2016   Procedure: RIGHT REVERSE SHOULDER ARTHROPLASTY;  Surgeon: Justice Britain, MD;  Location: [place];  Service: Orthopedics;  Laterality: Right;   VASECTOMY         Family History  Problem Relation Age of Onset   Cancer Mother        uncertain   Stroke Mother    Heart disease Father    Stroke Brother        brain aneurysm   Heart disease Brother     Social History   Tobacco Use   Smoking status: Never   Smokeless tobacco: Never  Vaping Use   Vaping Use: Never used  Substance Use Topics   Alcohol use: Yes    Comment: very rare   Drug use: No    Home Medications Prior to Admission medications   Medication Sig Start Date End Date Taking? Authorizing Provider  ibuprofen (ADVIL) 200 MG tablet Take 400 mg by mouth every 8 (eight) hours as needed for moderate pain.    [provider]  levothyroxine (  SYNTHROID) 50 MCG tablet Take 50 mcg by mouth daily before breakfast. 10/03/20   [provider]  metFORMIN (GLUCOPHAGE-XR) 500 MG 24 hr tablet Take 500 mg by mouth 2 (two) times daily. 10/25/20   [provider]  metoprolol succinate (TOPROL-XL) 25 MG 24 hr tablet Take 25 mg by mouth daily. 03/04/19   [provider]    Allergies    Patient has no known allergies.  Review of Systems   Review of Systems  Constitutional:  Positive for appetite change, chills, fatigue and fever.  HENT:  Negative for congestion.   Eyes:  Negative for visual disturbance.  Respiratory:  Positive for cough. Negative for chest tightness and shortness of breath.   Cardiovascular:  Negative for chest pain, palpitations and leg swelling.  Gastrointestinal:  Positive for nausea. Negative for abdominal  pain, diarrhea and vomiting.  Genitourinary:  Positive for decreased urine volume. Negative for dysuria, hematuria, penile pain and penile swelling.  Skin:  Negative for rash.  Neurological:  Positive for weakness. Negative for headaches.   Physical Exam Updated Vital Signs BP (!) 154/68   Pulse 73   Temp 99.7 F (37.6 C) (Oral)   Resp (!) 28   Ht 6' (1.829 m)   Wt 102.1 kg   SpO2 95%   BMI 30.52 kg/m   Physical Exam Vitals and nursing note reviewed.  Constitutional:      General: He is not in acute distress.    Appearance: Normal appearance.     Comments: Fatigued appearing  HENT:     Head: Normocephalic.     Mouth/Throat:     Mouth: Mucous membranes are moist.  Cardiovascular:     Rate and Rhythm: Normal rate.  Pulmonary:     Effort: Pulmonary effort is normal. No respiratory distress.     Breath sounds: No rales.  Abdominal:     Palpations: Abdomen is soft.     Tenderness: There is no abdominal tenderness.  Musculoskeletal:        General: No swelling.  Skin:    General: Skin is warm.  Neurological:     Mental Status: He is alert and oriented to person, place, and time. Mental status is at baseline.  Psychiatric:        Mood and Affect: Mood normal.    ED Results / Procedures / Treatments   Labs (all labs ordered are listed, but only abnormal results are displayed) Labs Reviewed  RESP PANEL BY RT-PCR (FLU A&B, COVID) ARPGX2  CBC WITH DIFFERENTIAL/PLATELET  COMPREHENSIVE METABOLIC PANEL  URINALYSIS, ROUTINE W REFLEX MICROSCOPIC    EKG None  Radiology No results found.  Procedures .Critical Care Performed by: Lorelle Gibbs, DO Authorized by: Lorelle Gibbs, DO   Critical care provider statement:    Critical care time (minutes):  45   Critical care time was exclusive of:  Separately billable procedures and treating other patients   Critical care was necessary to treat or prevent imminent or life-threatening deterioration of the following  conditions:  Sepsis   Critical care was time spent personally by me on the following activities:  Discussions with consultants, evaluation of patient's response to treatment, examination of patient, ordering and performing treatments and interventions, ordering and review of laboratory studies, ordering and review of radiographic studies, pulse oximetry, re-evaluation of patient's condition, obtaining history from patient or surrogate and review of old charts   Medications Ordered in ED Medications - No data to display  ED Course  I have reviewed the triage vital signs and the nursing notes.  Pertinent labs & imaging results that were available during my care of the patient were reviewed by me and considered in my medical decision making (see chart for details).    MDM Rules/Calculators/A&P                           87 year old male presents the emergency department with weakness.  Developed a fever while here with tachypnea.  Blood work is significant for leukocytosis of 22.9 with a left shift, UA is significant for UTI, urine culture has been sent.  Chest x-ray has questionable pneumonia.  Flu and COVID is negative.  Patient is being treated per septic protocol, antibiotics and fluids.  Fever has responded to antipyretic.  Patient is ill-appearing but improved.  Plan for admission for urosepsis and further treatment.  Patients evaluation and results requires admission for further treatment and care. Patient agrees with admission plan, offers no new complaints and is stable/unchanged at time of admit.  Final Clinical Impression(s) / ED Diagnoses Final diagnoses:  Cough    Rx / DC Orders ED Discharge Orders     None        Lorelle Gibbs, DO 05/22/21 1453

## 2021-05-22 NOTE — H&P (Signed)
History and Physical   Craig Jensen [id number] DOB: Jan 11, 1930 DOA: 05/22/2021  Referring MD/NP/PA: Dr. Dina Rich  PCP: Burnard Bunting, MD   Outpatient Specialists: None  Patient coming from: Home via [facility]  Chief Complaint: Extreme weakness and fatigue  HPI: Craig Jensen is a 87 y.o. male with medical history significant of diabetes, essential hypertension, hyperlipidemia, hypothyroidism, peripheral neuropathy, prostate cancer who presents to the ER with significant weakness fever and fatigue.  Symptoms have been going on for at least 2 days.  He is felt weak in his lower extremities to the point where he could not walk around.  He has a T-max of 103.  Patient also has decreased appetite nausea nonproductive cough.  He has history of prostate cancer and has a urinary symptoms a lot.  He has had multiple episodes UTI before.  He was last treated for UTI about 3 months ago.  Patient was found to meet sepsis criteria in the ER and has evidence of UTI.  Being admitted with sepsis due to UTI..  ED Course: Temperature is 102.1, blood pressure 150/87, pulse 42 respiratory of 30 and oxygen sat 91% on room air.  White count 22.9 hemoglobin 11.3 and platelets 220.  Lactic acid 2.0, urinalysis showed cloudy, nitrite positive many bacteria WBC more than 50..  Chest x-ray is negative  Review of Systems: As per HPI otherwise 10 point review of systems negative.    Past Medical History:  Diagnosis Date   Arthritis    Cataract    Diabetes mellitus without complication ([place])    History of kidney stones    Hyperlipidemia    Hypertension    Hypothyroidism    Inguinal hernia    Peripheral neuropathy    Prostate cancer ([place])    Prostate cancer ([place])     Past Surgical History:  Procedure Laterality Date   COLONOSCOPY     CYSTOSCOPY WITH URETHRAL DILATATION N/A 04/03/2021   Procedure: FLEXIBLE CYSTOSCOPY WITH URETHRAL DILATATION;  Surgeon: Robley Fries, MD;   Location: [facility];  Service: Urology;  Laterality: N/A;  90 MINS   DORSAL SLIT N/A 04/03/2021   Procedure: DORSAL SLIT;  Surgeon: Robley Fries, MD;  Location: [facility];  Service: Urology;  Laterality: N/A;   EYE SURGERY Bilateral    cataract surgery w/ lens implant   HERNIA REPAIR Left    inguinal hernia   INNER EAR SURGERY Right    age 80   KIDNEY STONE SURGERY     MEATOTOMY N/A 04/03/2021   Procedure: MEATOPLASTY ADULT;  Surgeon: Robley Fries, MD;  Location: [facility];  Service: Urology;  Laterality: N/A;   PROSTATE BIOPSY     REVERSE SHOULDER ARTHROPLASTY Right 01/11/2016   Procedure: RIGHT REVERSE SHOULDER ARTHROPLASTY;  Surgeon: Justice Britain, MD;  Location: [facility];  Service: Orthopedics;  Laterality: Right;   VASECTOMY       reports that he has never smoked. He has never used smokeless tobacco. He reports current alcohol use. He reports that he does not use drugs.  No Known Allergies  Family History  Problem Relation Age of Onset   Cancer Mother        uncertain   Stroke Mother    Heart disease Father    Stroke Brother        brain aneurysm   Heart disease Brother      Prior to Admission medications   Medication Sig Start Date End Date Taking? Authorizing Provider  ibuprofen (ADVIL) 200 MG tablet Take 400 mg by mouth every 8 (eight) hours as needed for moderate pain.    [provider]  levothyroxine (SYNTHROID) 50 MCG tablet Take 50 mcg by mouth daily before breakfast. 10/03/20   [provider]  metFORMIN (GLUCOPHAGE-XR) 500 MG 24 hr tablet Take 500 mg by mouth 2 (two) times daily. 10/25/20   [provider]  metoprolol succinate (TOPROL-XL) 25 MG 24 hr tablet Take 25 mg by mouth daily. 03/04/19   [provider]    Physical Exam: Vitals:   05/22/21 1630 05/22/21 1700 05/22/21 1740 05/22/21 1828  BP: (!) 150/64 (!) 161/75  (!) 150/75  Pulse: 80 82  78  Resp: (!) 28   (!) 30  Temp:   (!) 102.1 F (38.9 C) 99.2 F (37.3 C)   TempSrc:    Oral  SpO2: 96% 96%  96%  Weight:      Height:          Constitutional: Acutely ill looking, frail, no distress Vitals:   05/22/21 1630 05/22/21 1700 05/22/21 1740 05/22/21 1828  BP: (!) 150/64 (!) 161/75  (!) 150/75  Pulse: 80 82  78  Resp: (!) 28   (!) 30  Temp:   (!) 102.1 F (38.9 C) 99.2 F (37.3 C)  TempSrc:    Oral  SpO2: 96% 96%  96%  Weight:      Height:       Eyes: PERRL, lids and conjunctivae normal ENMT: Mucous membranes are dry. Posterior pharynx clear of any exudate or lesions.Normal dentition.  Neck: normal, supple, no masses, no thyromegaly Respiratory: clear to auscultation bilaterally, no wheezing, no crackles. Normal respiratory effort. No accessory muscle use.  Cardiovascular: Sinus tachycardia, no murmurs / rubs / gallops. No extremity edema. 2+ pedal pulses. No carotid bruits.  Abdomen: no tenderness, no masses palpated. No hepatosplenomegaly. Bowel sounds positive.  Musculoskeletal: no clubbing / cyanosis. No joint deformity upper and lower extremities. Good ROM, no contractures. Normal muscle tone.  Skin: no rashes, lesions, ulcers. No induration Neurologic: CN 2-12 grossly intact. Sensation intact, DTR normal. Strength 5/5 in all 4.  Psychiatric: Lethargic and sleepy 3. Normal mood.     Labs on Admission: I have personally reviewed following labs and imaging studies  CBC: Recent Labs  Lab 05/22/21 0910  WBC 22.9*  NEUTROABS 18.5*  HGB 12.8*  HCT 38.5*  MCV 93.2  PLT [id number]   Basic Metabolic Panel: Recent Labs  Lab 05/22/21 0910  NA 135  K 4.0  CL 102  CO2 23  GLUCOSE 204*  BUN 23  CREATININE 1.30*  CALCIUM 8.7*   GFR: Estimated Creatinine Clearance: 47.6 mL/min (A) (by C-G formula based on SCr of 1.3 mg/dL (H)). Liver Function Tests: Recent Labs  Lab 05/22/21 0910  AST 18  ALT 16  ALKPHOS 67  BILITOT 0.9  PROT 7.0  ALBUMIN 3.3*   No results for input(s): LIPASE, AMYLASE in the last 168 hours. No results for  input(s): AMMONIA in the last 168 hours. Coagulation Profile: No results for input(s): INR, PROTIME in the last 168 hours. Cardiac Enzymes: No results for input(s): CKTOTAL, CKMB, CKMBINDEX, TROPONINI in the last 168 hours. BNP (last 3 results) No results for input(s): PROBNP in the last 8760 hours. HbA1C: No results for input(s): HGBA1C in the last 72 hours. CBG: Recent Labs  Lab 05/22/21 1717  GLUCAP 159*   Lipid Profile: No results for input(s): CHOL, HDL, LDLCALC, TRIG, CHOLHDL, LDLDIRECT  in the last 72 hours. Thyroid Function Tests: No results for input(s): TSH, T4TOTAL, FREET4, T3FREE, THYROIDAB in the last 72 hours. Anemia Panel: No results for input(s): VITAMINB12, FOLATE, FERRITIN, TIBC, IRON, RETICCTPCT in the last 72 hours. Urine analysis:    Component Value Date/Time   COLORURINE YELLOW 05/22/2021 1105   APPEARANCEUR CLOUDY (A) 05/22/2021 1105   LABSPEC 1.020 05/22/2021 1105   PHURINE 6.0 05/22/2021 1105   GLUCOSEU NEGATIVE 05/22/2021 1105   HGBUR SMALL (A) 05/22/2021 1105   BILIRUBINUR NEGATIVE 05/22/2021 1105   KETONESUR NEGATIVE 05/22/2021 1105   PROTEINUR 30 (A) 05/22/2021 1105   UROBILINOGEN 0.2 04/12/2008 2025   NITRITE POSITIVE (A) 05/22/2021 1105   LEUKOCYTESUR MODERATE (A) 05/22/2021 1105   Sepsis Labs: '@LABRCNTIP'$ (procalcitonin:4,lacticidven:4) ) Recent Results (from the past 240 hour(s))  Resp Panel by RT-PCR (Flu A&B, Covid) Nasopharyngeal Swab     Status: None   Collection Time: 05/22/21 10:05 AM   Specimen: Nasopharyngeal Swab; Nasopharyngeal(NP) swabs in vial transport medium  Result Value Ref Range Status   SARS Coronavirus 2 by RT PCR NEGATIVE NEGATIVE Final    Comment: (NOTE) SARS-CoV-2 target nucleic acids are NOT DETECTED.  The SARS-CoV-2 RNA is generally detectable in upper respiratory specimens during the acute phase of infection. The lowest concentration of SARS-CoV-2 viral copies this assay can detect is 138 copies/mL. A negative  result does not preclude SARS-Cov-2 infection and should not be used as the sole basis for treatment or other patient management decisions. A negative result may occur with  improper specimen collection/handling, submission of specimen other than nasopharyngeal swab, presence of viral mutation(s) within the areas targeted by this assay, and inadequate number of viral copies(<138 copies/mL). A negative result must be combined with clinical observations, patient history, and epidemiological information. The expected result is Negative.  Fact Sheet for Patients:  EntrepreneurPulse.com.au  Fact Sheet for Healthcare Providers:  IncredibleEmployment.be  This test is no t yet approved or cleared by the [place] FDA and  has been authorized for detection and/or diagnosis of SARS-CoV-2 by FDA under an Emergency Use Authorization (EUA). This EUA will remain  in effect (meaning this test can be used) for the duration of the COVID-19 declaration under Section 564(b)(1) of the Act, 21 U.S.C.section 360bbb-3(b)(1), unless the authorization is terminated  or revoked sooner.       Influenza A by PCR NEGATIVE NEGATIVE Final   Influenza B by PCR NEGATIVE NEGATIVE Final    Comment: (NOTE) The Xpert Xpress SARS-CoV-2/FLU/RSV plus assay is intended as an aid in the diagnosis of influenza from Nasopharyngeal swab specimens and should not be used as a sole basis for treatment. Nasal washings and aspirates are unacceptable for Xpert Xpress SARS-CoV-2/FLU/RSV testing.  Fact Sheet for Patients: EntrepreneurPulse.com.au  Fact Sheet for Healthcare Providers: IncredibleEmployment.be  This test is not yet approved or cleared by the [place] FDA and has been authorized for detection and/or diagnosis of SARS-CoV-2 by FDA under an Emergency Use Authorization (EUA). This EUA will remain in effect (meaning this test can be used)  for the duration of the COVID-19 declaration under Section 564(b)(1) of the Act, 21 U.S.C. section 360bbb-3(b)(1), unless the authorization is terminated or revoked.  Performed at [facility], [address] [place], [place] [address]      Radiological Exams on Admission: DG Chest [facility] 1 View  Result Date: 05/22/2021 CLINICAL DATA:  Cough and fever EXAM: PORTABLE CHEST 1 VIEW COMPARISON:  04/13/2008 FINDINGS: Low volume chest with  perihilar indistinctness. Possible cardiomegaly that limited by low volume technique. Aortic tortuosity which is chronic. No edema, effusion, or pneumothorax. Severe glenohumeral osteoarthritis on the left. Reverse glenohumeral arthroplasty on the right. IMPRESSION: Low volume chest with perihilar that could reflect pneumonia. Electronically Signed   By: Jorje Guild M.D.   On: 05/22/2021 10:53    EKG: Independently reviewed. It shows NSR with no significant ST changes  Assessment/Plan Principal Problem:   Sepsis secondary to UTI [facility]) Active Problems:   Malignant neoplasm of prostate (HCC)   Hypokalemia   Pressure injury of skin   Cough   Acute cystitis with hematuria   Essential hypertension   Generalized anxiety disorder   Other hyperlipidemia   Primary hypothyroidism   Polyneuropathy   Type 2 diabetes mellitus without complications ([place])     #1 sepsis secondary to UTI: Patient will be admitted.  IV Rocephin.  Blood and urine cultures obtained.  Supportive care.  Follow the sepsis protocol.  #2 hypokalemia: Replete potassium.  #3 non-insulin-dependent diabetes: Hold metformin.  Glucose surveillance.  #4 essential hypertension: On amlodipine and metoprolol from home.  We will resume.  #5 hypothyroidism: Continue levothyroxine.  #6 history of prostate cancer: Not on any significant treatment at the moment.  Continue outpatient follow-up with oncology.  #7 hyperlipidemia: Does not appear to be on any treatment at the moment.   Defer to his PCP  #8 generalized anxiety disorder: Counseling provided   DVT prophylaxis: Lovenox Code Status: Full code Family Communication: No family at bedside Disposition Plan: Home Consults called: None Admission status: Inpatient  Severity of Illness: The appropriate patient status for this patient is INPATIENT. Inpatient status is judged to be reasonable and necessary in order to provide the required intensity of service to ensure the patient's safety. The patient's presenting symptoms, physical exam findings, and initial radiographic and laboratory data in the context of their chronic comorbidities is felt to place them at high risk for further clinical deterioration. Furthermore, it is not anticipated that the patient will be medically stable for discharge from the hospital within 2 midnights of admission. The following factors support the patient status of inpatient.   " The patient's presenting symptoms include fever chills and weakness. " The worrisome physical exam findings include dry mucous membranes. " The initial radiographic and laboratory data are worrisome because of meet sepsis criteria. " The chronic co-morbidities include diabetes with hypertension.   * I certify that at the point of admission it is my clinical judgment that the patient will require inpatient hospital care spanning beyond 2 midnights from the point of admission due to high intensity of service, high risk for further deterioration and high frequency of surveillance required.Barbette Merino MD [facility] Pager [phone]  If 7PM-7AM, please contact night-coverage [email or web address] Password [facility]  05/22/2021, 7:16 PM

## 2021-05-23 DIAGNOSIS — E876 Hypokalemia: Secondary | ICD-10-CM | POA: Diagnosis not present

## 2021-05-23 DIAGNOSIS — I10 Essential (primary) hypertension: Secondary | ICD-10-CM | POA: Diagnosis not present

## 2021-05-23 DIAGNOSIS — F411 Generalized anxiety disorder: Secondary | ICD-10-CM

## 2021-05-23 DIAGNOSIS — E039 Hypothyroidism, unspecified: Secondary | ICD-10-CM

## 2021-05-23 DIAGNOSIS — R7881 Bacteremia: Secondary | ICD-10-CM

## 2021-05-23 DIAGNOSIS — E119 Type 2 diabetes mellitus without complications: Secondary | ICD-10-CM

## 2021-05-23 DIAGNOSIS — E7849 Other hyperlipidemia: Secondary | ICD-10-CM

## 2021-05-23 DIAGNOSIS — C61 Malignant neoplasm of prostate: Secondary | ICD-10-CM

## 2021-05-23 DIAGNOSIS — A419 Sepsis, unspecified organism: Secondary | ICD-10-CM | POA: Diagnosis not present

## 2021-05-23 LAB — BLOOD CULTURE ID PANEL (REFLEXED) - BCID2

## 2021-05-23 LAB — CBC
HCT: 35.2 % — ABNORMAL LOW (ref 39.0–52.0)
Hemoglobin: 11.7 g/dL — ABNORMAL LOW (ref 13.0–17.0)
MCH: 31.1 pg (ref 26.0–34.0)
MCHC: 33.2 g/dL (ref 30.0–36.0)
MCV: 93.6 fL (ref 80.0–100.0)
Platelets: 194 10*3/uL (ref 150–400)
RBC: 3.76 MIL/uL — ABNORMAL LOW (ref 4.22–5.81)
RDW: 13.2 % (ref 11.5–15.5)
WBC: 18.6 10*3/uL — ABNORMAL HIGH (ref 4.0–10.5)
nRBC: 0 % (ref 0.0–0.2)

## 2021-05-23 LAB — COMPREHENSIVE METABOLIC PANEL
ALT: 22 U/L (ref 0–44)
AST: 24 U/L (ref 15–41)
Albumin: 2.7 g/dL — ABNORMAL LOW (ref 3.5–5.0)
Alkaline Phosphatase: 68 U/L (ref 38–126)
Anion gap: 8 (ref 5–15)
BUN: 16 mg/dL (ref 8–23)
CO2: 25 mmol/L (ref 22–32)
Calcium: 8.8 mg/dL — ABNORMAL LOW (ref 8.9–10.3)
Chloride: 106 mmol/L (ref 98–111)
Creatinine, Ser: 0.9 mg/dL (ref 0.61–1.24)
GFR, Estimated: >60 mL/min (ref >60)
Glucose, Bld: 184 mg/dL — ABNORMAL HIGH (ref 70–99)
Potassium: 4 mmol/L (ref 3.5–5.1)
Sodium: 139 mmol/L (ref 135–145)
Total Bilirubin: 1.1 mg/dL (ref 0.3–1.2)
Total Protein: 6 g/dL — ABNORMAL LOW (ref 6.5–8.1)

## 2021-05-23 LAB — GLUCOSE, CAPILLARY
Glucose-Capillary: 194 mg/dL — ABNORMAL HIGH (ref 70–99)
Glucose-Capillary: 228 mg/dL — ABNORMAL HIGH (ref 70–99)
Glucose-Capillary: 146 mg/dL — ABNORMAL HIGH (ref 70–99)
Glucose-Capillary: 179 mg/dL — ABNORMAL HIGH (ref 70–99)

## 2021-05-23 LAB — PROCALCITONIN: Procalcitonin: 0.99 ng/mL

## 2021-05-23 LAB — PROTIME-INR
INR: 4.8 (ref 0.8–1.2)
Prothrombin Time: 44.6 seconds — ABNORMAL HIGH (ref 11.4–15.2)

## 2021-05-23 LAB — CORTISOL-AM, BLOOD: Cortisol - AM: 26.6 ug/dL — ABNORMAL HIGH (ref 6.7–22.6)

## 2021-05-23 MED ORDER — ORAL CARE MOUTH RINSE
15.0000 mL | Freq: Two times a day (BID) | OROMUCOSAL | Status: DC
  Administered 2021-05-23 – 2021-05-27 (×9): 15 mL via OROMUCOSAL

## 2021-05-23 MED ORDER — ENOXAPARIN SODIUM 40 MG/0.4ML IJ SOSY
40.0000 mg | PREFILLED_SYRINGE | INTRAMUSCULAR | Status: DC
  Administered 2021-05-24 – 2021-05-26 (×3): 40 mg via SUBCUTANEOUS
  Filled 2021-05-23 (×3): qty 0.4

## 2021-05-23 MED ORDER — ALUM & MAG HYDROXIDE-SIMETH 200-200-20 MG/5ML PO SUSP
15.0000 mL | Freq: Four times a day (QID) | ORAL | Status: DC | PRN
  Administered 2021-05-23: 15 mL via ORAL
  Filled 2021-05-23: qty 30

## 2021-05-23 MED ORDER — HYDROXYZINE HCL 25 MG PO TABS: 25.0000 mg | ORAL_TABLET | Freq: Three times a day (TID) | ORAL | Status: DC | PRN

## 2021-05-23 MED ORDER — METOPROLOL SUCCINATE ER 25 MG PO TB24
25.0000 mg | ORAL_TABLET | Freq: Every day | ORAL | Status: DC
  Administered 2021-05-23 – 2021-05-27 (×5): 25 mg via ORAL
  Filled 2021-05-23 (×5): qty 1

## 2021-05-23 MED ORDER — AMLODIPINE BESYLATE 5 MG PO TABS
5.0000 mg | ORAL_TABLET | Freq: Every day | ORAL | Status: DC
  Administered 2021-05-23 – 2021-05-27 (×5): 5 mg via ORAL
  Filled 2021-05-23 (×5): qty 1

## 2021-05-23 MED ORDER — DARIFENACIN HYDROBROMIDE ER 7.5 MG PO TB24
7.5000 mg | ORAL_TABLET | Freq: Every day | ORAL | Status: DC
  Administered 2021-05-23 – 2021-05-27 (×5): 7.5 mg via ORAL
  Filled 2021-05-23 (×5): qty 1

## 2021-05-23 MED ORDER — LEVOTHYROXINE SODIUM 50 MCG PO TABS
50.0000 ug | ORAL_TABLET | Freq: Every day | ORAL | Status: DC
  Administered 2021-05-24 – 2021-05-27 (×4): 50 ug via ORAL
  Filled 2021-05-23 (×4): qty 1

## 2021-05-23 NOTE — Plan of Care (Signed)
  Problem: Education: Goal: Knowledge of General Education information will improve Description: Including pain rating scale, medication(s)/side effects and non-pharmacologic comfort measures Outcome: Progressing   Problem: Health Behavior/Discharge Planning: Goal: Ability to manage health-related needs will improve Outcome: Progressing   Problem: Clinical Measurements: Goal: Ability to maintain clinical measurements within normal limits will improve Outcome: Progressing Goal: Will remain free from infection Outcome: Progressing Note: IV abx. +blood cultures.  Goal: Diagnostic test results will improve Outcome: Progressing Goal: Respiratory complications will improve Outcome: Progressing Goal: Cardiovascular complication will be avoided Outcome: Progressing   Problem: Activity: Goal: Risk for activity intolerance will decrease Outcome: Progressing   Problem: Nutrition: Goal: Adequate nutrition will be maintained Outcome: Progressing   Problem: Pain Managment: Goal: General experience of comfort will improve Outcome: Progressing   Problem: Safety: Goal: Ability to remain free from injury will improve Outcome: Progressing   Problem: Skin Integrity: Goal: Risk for impaired skin integrity will decrease Outcome: Progressing

## 2021-05-23 NOTE — Progress Notes (Signed)
PHARMACY - PHYSICIAN COMMUNICATION CRITICAL VALUE ALERT - BLOOD CULTURE IDENTIFICATION (BCID)  Craig Jensen is an 87 y.o. male who presented to [facility] on 05/22/2021 with a chief complaint of urosepsis.  Assessment: Bcx: Klebsiella Pneumonia  Name of physician (or Provider) Contacted: Dr. Grandville Silos  Current antibiotics: Ceftriaxone 2g IV q24h   Changes to prescribed antibiotics recommended:  Patient is on recommended antibiotics - No changes needed  No results found for this or any previous visit.  Gretta Arab PharmD, BCPS Clinical Pharmacist [facility] main pharmacy [phone] 05/23/2021 9:23 AM

## 2021-05-23 NOTE — Progress Notes (Signed)
PROGRESS NOTE    Craig Jensen  [id number] DOB: [birth year] DOA: 05/22/2021 PCP: Burnard Bunting, MD    Chief Complaint  Patient presents with   Extremity Weakness    Brief Narrative:  Patient is a pleasant 87 year old gentleman history of diabetes, hypertension, hyperlipidemia, hypothyroidism, peripheral neuropathy, prostate cancer presented to the ED with significant weakness fever and fatigue.  Work-up concerning for UTI as well as bacteremia.  Patient placed empirically on IV Rocephin   Assessment & Plan:   Principal Problem:   Sepsis secondary to UTI [facility]) Active Problems:   Malignant neoplasm of prostate ([place])   Hypokalemia   Pressure injury of skin   Cough   Acute cystitis with hematuria   Essential hypertension   Generalized anxiety disorder   Other hyperlipidemia   Primary hypothyroidism   Polyneuropathy   Type 2 diabetes mellitus without complications ([place])   #1 sepsis secondary to bacteremia and UTI -Patient presented with significant weakness, fever with a temp as high as 102.1 in the ED, increased respiratory rate of 30, leukocytosis with a white count of 20.6, lactic acid level of 2.1, urinalysis concerning for UTI.  Patient pancultured with preliminary blood cultures positive for Klebsiella pneumonia by [facility] ID.  Sensitivities pending. -Continue empiric IV Rocephin.  2.  Hypokalemia -Repleted.  Potassium of 4.0.  Follow.  3.  Noninsulin-dependent diabetes mellitus -Hemoglobin A1c 7.3(03/29/2021). -Hold oral hypoglycemic agents -SSI.  4.  Hypothyroidism -Resume home dose Synthroid.  5.  Hypertension -Resume home regimen Norvasc and metoprolol.  6.  History of prostate cancer -Not on treatment at this time. -Outpatient follow-up with urology and oncology.  7.  Hyperlipidemia -Not on a statin. - outpatient follow-up with PCP.  8.  Generalized anxiety disorder -Supportive care. -Hydroxyzine as needed.   DVT prophylaxis: Lovenox Code  Status: Full Family Communication: Updated patient.  Updated wife at bedside. Disposition:   Status is: Inpatient  Remains inpatient appropriate because:Inpatient level of care appropriate due to severity of illness  Dispo: The patient is from: Home              Anticipated d/c is to: Home              Patient currently is not medically stable to d/c.   Difficult to place patient No       Consultants:  None  Procedures:  Chest x-ray 05/22/2021  Antimicrobials:  IV Rocephin 05/22/2021>>>>>   Subjective: Laying in bed.  Still with significant weakness however overall feeling better.  No chest pain.  No shortness of breath.  Alert and oriented to self and place.  Following commands appropriately.  Objective: Vitals:   05/22/21 2350 05/23/21 0236 05/23/21 0713 05/23/21 1205  BP:  131/67 140/70 (!) 147/67  Pulse:  73 83 77  Resp:  20 (!) 22 16  Temp: (!) 100.4 F (38 C) 99 F (37.2 C) 100.3 F (37.9 C) 98.4 F (36.9 C)  TempSrc: Rectal Oral Oral Oral  SpO2:  99% 97% 99%  Weight:      Height:        Intake/Output Summary (Last 24 hours) at 05/23/2021 1344 Last data filed at 05/23/2021 0906 Gross per 24 hour  Intake 2900 ml  Output 750 ml  Net 2150 ml   Filed Weights   05/22/21 0811  Weight: 102.1 kg    Examination:  General exam: Appears calm and comfortable  Respiratory system: Clear to auscultation bilaterally.  No wheezes, no crackles, no rhonchi.Marland Kitchen Respiratory effort  normal. Cardiovascular system: S1 & S2 heard, RRR. No JVD, murmurs, rubs, gallops or clicks. No pedal edema. Gastrointestinal system: Abdomen is nondistended, soft and nontender. No organomegaly or masses felt. Normal bowel sounds heard. Central nervous system: Alert and oriented. No focal neurological deficits. Extremities: Symmetric 5 x 5 power. Skin: No rashes, lesions or ulcers Psychiatry: Judgement and insight appear normal. Mood & affect appropriate.     Data Reviewed: I have  personally reviewed following labs and imaging studies  CBC: Recent Labs  Lab 05/22/21 0910 05/22/21 1934 05/23/21 0528  WBC 22.9* 20.6* 18.6*  NEUTROABS 18.5*  --   --   HGB 12.8* 11.3* 11.7*  HCT 38.5* 33.3* 35.2*  MCV 93.2 91.7 93.6  PLT 290 220 [id number]    Basic Metabolic Panel: Recent Labs  Lab 05/22/21 0910 05/22/21 1934 05/23/21 0528  NA 135  --  139  K 4.0  --  4.0  CL 102  --  106  CO2 23  --  25  GLUCOSE 204*  --  184*  BUN 23  --  16  CREATININE 1.30* 0.95 0.90  CALCIUM 8.7*  --  8.8*    GFR: Estimated Creatinine Clearance: 68.8 mL/min (by C-G formula based on SCr of 0.9 mg/dL).  Liver Function Tests: Recent Labs  Lab 05/22/21 0910 05/23/21 0528  AST 18 24  ALT 16 22  ALKPHOS 67 68  BILITOT 0.9 1.1  PROT 7.0 6.0*  ALBUMIN 3.3* 2.7*    CBG: Recent Labs  Lab 05/22/21 1717 05/22/21 2324 05/23/21 0720 05/23/21 1109  GLUCAP 159* 201* 194* 228*     Recent Results (from the past 240 hour(s))  Culture, blood (single)     Status: None (Preliminary result)   Collection Time: 05/22/21  9:10 AM   Specimen: Left Antecubital; Blood  Result Value Ref Range Status   Specimen Description   Final    LEFT ANTECUBITAL Performed at [facility], [address] [place], [place] [address]    Special Requests   Final    BOTTLES DRAWN AEROBIC AND ANAEROBIC Blood Culture adequate volume Performed at [facility], [address] [place], [place] [address]    Culture  Setup Time   Final    GRAM NEGATIVE RODS ANAEROBIC BOTTLE ONLY CRITICAL RESULT CALLED TO, READ BACK BY AND VERIFIED WITH: PHARMD M.LILLISTON AT [id number] ON 05/23/2021 BY T.SAAD. Performed at [facility], [place] [address], [place],  [place] [address]    Culture GRAM NEGATIVE RODS  Final   Report Status PENDING  Incomplete  Blood Culture ID Panel (Reflexed)     Status: Abnormal   Collection Time: 05/22/21  9:10 AM  Result Value Ref Range Status   Enterococcus faecalis  NOT DETECTED NOT DETECTED Final   Enterococcus Faecium NOT DETECTED NOT DETECTED Final   Listeria monocytogenes NOT DETECTED NOT DETECTED Final   Staphylococcus species NOT DETECTED NOT DETECTED Final   Staphylococcus aureus (BCID) NOT DETECTED NOT DETECTED Final   Staphylococcus epidermidis NOT DETECTED NOT DETECTED Final   Staphylococcus lugdunensis NOT DETECTED NOT DETECTED Final   Streptococcus species NOT DETECTED NOT DETECTED Final   Streptococcus agalactiae NOT DETECTED NOT DETECTED Final   Streptococcus pneumoniae NOT DETECTED NOT DETECTED Final   Streptococcus pyogenes NOT DETECTED NOT DETECTED Final   A.calcoaceticus-baumannii NOT DETECTED NOT DETECTED Final   Bacteroides fragilis NOT DETECTED NOT DETECTED Final   Enterobacterales DETECTED (A) NOT DETECTED Final    Comment:  Enterobacterales represent a large order of gram negative bacteria, not a single organism. CRITICAL RESULT CALLED TO, READ BACK BY AND VERIFIED WITH: PHARMD M.LILLISTON AT [id number] ON 05/23/2021 BY T.SAAD.    Enterobacter cloacae complex NOT DETECTED NOT DETECTED Final   Escherichia coli NOT DETECTED NOT DETECTED Final   Klebsiella aerogenes NOT DETECTED NOT DETECTED Final   Klebsiella oxytoca NOT DETECTED NOT DETECTED Final   Klebsiella pneumoniae DETECTED (A) NOT DETECTED Final    Comment: CRITICAL RESULT CALLED TO, READ BACK BY AND VERIFIED WITH: PHARMD M.LILLISTON AT [id number] ON 05/23/2021 BY T.SAAD.    Proteus species NOT DETECTED NOT DETECTED Final   Salmonella species NOT DETECTED NOT DETECTED Final   Serratia marcescens NOT DETECTED NOT DETECTED Final   Haemophilus influenzae NOT DETECTED NOT DETECTED Final   Neisseria meningitidis NOT DETECTED NOT DETECTED Final   Pseudomonas aeruginosa NOT DETECTED NOT DETECTED Final   Stenotrophomonas maltophilia NOT DETECTED NOT DETECTED Final   Candida albicans NOT DETECTED NOT DETECTED Final   Candida auris NOT DETECTED NOT DETECTED Final   Candida glabrata NOT  DETECTED NOT DETECTED Final   Candida krusei NOT DETECTED NOT DETECTED Final   Candida parapsilosis NOT DETECTED NOT DETECTED Final   Candida tropicalis NOT DETECTED NOT DETECTED Final   Cryptococcus neoformans/gattii NOT DETECTED NOT DETECTED Final   CTX-M ESBL NOT DETECTED NOT DETECTED Final   Carbapenem resistance IMP NOT DETECTED NOT DETECTED Final   Carbapenem resistance KPC NOT DETECTED NOT DETECTED Final   Carbapenem resistance NDM NOT DETECTED NOT DETECTED Final   Carbapenem resist OXA 48 LIKE NOT DETECTED NOT DETECTED Final   Carbapenem resistance VIM NOT DETECTED NOT DETECTED Final    Comment: Performed at [facility], [address], [place], [place] [address]  Resp Panel by RT-PCR (Flu A&B, Covid) Nasopharyngeal Swab     Status: None   Collection Time: 05/22/21 10:05 AM   Specimen: Nasopharyngeal Swab; Nasopharyngeal(NP) swabs in vial transport medium  Result Value Ref Range Status   SARS Coronavirus 2 by RT PCR NEGATIVE NEGATIVE Final    Comment: (NOTE) SARS-CoV-2 target nucleic acids are NOT DETECTED.  The SARS-CoV-2 RNA is generally detectable in upper respiratory specimens during the acute phase of infection. The lowest concentration of SARS-CoV-2 viral copies this assay can detect is 138 copies/mL. A negative result does not preclude SARS-Cov-2 infection and should not be used as the sole basis for treatment or other patient management decisions. A negative result may occur with  improper specimen collection/handling, submission of specimen other than nasopharyngeal swab, presence of viral mutation(s) within the areas targeted by this assay, and inadequate number of viral copies(<138 copies/mL). A negative result must be combined with clinical observations, patient history, and epidemiological information. The expected result is Negative.  Fact Sheet for Patients:  EntrepreneurPulse.com.au  Fact Sheet for Healthcare Providers:   IncredibleEmployment.be  This test is no t yet approved or cleared by the [place] FDA and  has been authorized for detection and/or diagnosis of SARS-CoV-2 by FDA under an Emergency Use Authorization (EUA). This EUA will remain  in effect (meaning this test can be used) for the duration of the COVID-19 declaration under Section 564(b)(1) of the Act, 21 U.S.C.section 360bbb-3(b)(1), unless the authorization is terminated  or revoked sooner.       Influenza A by PCR NEGATIVE NEGATIVE Final   Influenza B by PCR NEGATIVE NEGATIVE Final    Comment: (NOTE) The Xpert Xpress SARS-CoV-2/FLU/RSV plus assay is  intended as an aid in the diagnosis of influenza from Nasopharyngeal swab specimens and should not be used as a sole basis for treatment. Nasal washings and aspirates are unacceptable for Xpert Xpress SARS-CoV-2/FLU/RSV testing.  Fact Sheet for Patients: EntrepreneurPulse.com.au  Fact Sheet for Healthcare Providers: IncredibleEmployment.be  This test is not yet approved or cleared by the [place] FDA and has been authorized for detection and/or diagnosis of SARS-CoV-2 by FDA under an Emergency Use Authorization (EUA). This EUA will remain in effect (meaning this test can be used) for the duration of the COVID-19 declaration under Section 564(b)(1) of the Act, 21 U.S.C. section 360bbb-3(b)(1), unless the authorization is terminated or revoked.  Performed at [facility], [address], [place], [place] [address]          Radiology Studies: DG Chest [place] 1 View  Result Date: 05/22/2021 CLINICAL DATA:  Cough and fever EXAM: PORTABLE CHEST 1 VIEW COMPARISON:  04/13/2008 FINDINGS: Low volume chest with perihilar indistinctness. Possible cardiomegaly that limited by low volume technique. Aortic tortuosity which is chronic. No edema, effusion, or pneumothorax. Severe glenohumeral osteoarthritis on the  left. Reverse glenohumeral arthroplasty on the right. IMPRESSION: Low volume chest with perihilar that could reflect pneumonia. Electronically Signed   By: Jorje Guild M.D.   On: 05/22/2021 10:53        Scheduled Meds:  enoxaparin (LOVENOX) injection  40 mg Subcutaneous Q24H   influenza vaccine adjuvanted  0.5 mL Intramuscular Tomorrow-1000   insulin aspart  0-15 Units Subcutaneous TID WC   insulin aspart  0-5 Units Subcutaneous QHS   mouth rinse  15 mL Mouth Rinse BID   Continuous Infusions:  cefTRIAXone (ROCEPHIN)  IV 2 g (05/23/21 1323)   lactated ringers 100 mL/hr at 05/23/21 1324     LOS: 1 day    Time spent: 40 minutes    Irine Seal, MD [facility]   To contact the attending provider between 7A-7P or the covering provider during after hours 7P-7A, please log into the web site [email or web address] and access using universal [place] password for that web site. If you do not have the password, please call the hospital operator.  05/23/2021, 1:44 PM

## 2021-05-23 NOTE — Progress Notes (Signed)
VAST RN in route to another patient's call. IV pump beeping.  In to patient's room, introduced self and assessed IV in left arm and IV pump. Large amount of Kerlix removed from IV site in patient's left AC; IV catheter slightly kinked. IV tubing readjusted and secured to allow for non-kinking of IV catheter. No blood return noted but flushed easily. IV fluids restarted and pressure indicator on IV pump decreased significantly. Teaching provided regarding position of IV and pressure indicator on IV pump. Advised if pump continues to beep, to notify unit RN to place IV consult and new IV can be placed in a better location. Patient verbalized understanding.VAST RN also assessed PIV in patient's right AC; leaking with flush; notified unit RN to dc.

## 2021-05-24 DIAGNOSIS — A419 Sepsis, unspecified organism: Secondary | ICD-10-CM | POA: Diagnosis not present

## 2021-05-24 DIAGNOSIS — I10 Essential (primary) hypertension: Secondary | ICD-10-CM | POA: Diagnosis not present

## 2021-05-24 DIAGNOSIS — E876 Hypokalemia: Secondary | ICD-10-CM | POA: Diagnosis not present

## 2021-05-24 DIAGNOSIS — F411 Generalized anxiety disorder: Secondary | ICD-10-CM | POA: Diagnosis not present

## 2021-05-24 LAB — CBC WITH DIFFERENTIAL/PLATELET
Abs Immature Granulocytes: 0.2 10*3/uL — ABNORMAL HIGH (ref 0.00–0.07)
Basophils Absolute: 0 10*3/uL (ref 0.0–0.1)
Basophils Relative: 0 %
Eosinophils Absolute: 0.2 10*3/uL (ref 0.0–0.5)
Eosinophils Relative: 2 %
HCT: 32 % — ABNORMAL LOW (ref 39.0–52.0)
Hemoglobin: 10.6 g/dL — ABNORMAL LOW (ref 13.0–17.0)
Immature Granulocytes: 2 %
Lymphocytes Relative: 9 %
Lymphs Abs: 1.1 10*3/uL (ref 0.7–4.0)
MCH: 30.7 pg (ref 26.0–34.0)
MCHC: 33.1 g/dL (ref 30.0–36.0)
MCV: 92.8 fL (ref 80.0–100.0)
Monocytes Absolute: 1 10*3/uL (ref 0.1–1.0)
Monocytes Relative: 8 %
Neutro Abs: 10.5 10*3/uL — ABNORMAL HIGH (ref 1.7–7.7)
Neutrophils Relative %: 79 %
Platelets: 194 10*3/uL (ref 150–400)
RBC: 3.45 MIL/uL — ABNORMAL LOW (ref 4.22–5.81)
RDW: 13.2 % (ref 11.5–15.5)
WBC: 13.1 10*3/uL — ABNORMAL HIGH (ref 4.0–10.5)
nRBC: 0 % (ref 0.0–0.2)

## 2021-05-24 LAB — PROTIME-INR
INR: 1.1 (ref 0.8–1.2)
Prothrombin Time: 14.1 seconds (ref 11.4–15.2)

## 2021-05-24 LAB — BASIC METABOLIC PANEL
Anion gap: 8 (ref 5–15)
BUN: 15 mg/dL (ref 8–23)
CO2: 24 mmol/L (ref 22–32)
Calcium: 8.6 mg/dL — ABNORMAL LOW (ref 8.9–10.3)
Chloride: 106 mmol/L (ref 98–111)
Creatinine, Ser: 0.79 mg/dL (ref 0.61–1.24)
GFR, Estimated: >60 mL/min (ref >60)
Glucose, Bld: 238 mg/dL — ABNORMAL HIGH (ref 70–99)
Potassium: 3.7 mmol/L (ref 3.5–5.1)
Sodium: 138 mmol/L (ref 135–145)

## 2021-05-24 LAB — MAGNESIUM: Magnesium: 1.6 mg/dL — ABNORMAL LOW (ref 1.7–2.4)

## 2021-05-24 LAB — GLUCOSE, CAPILLARY
Glucose-Capillary: 167 mg/dL — ABNORMAL HIGH (ref 70–99)
Glucose-Capillary: 224 mg/dL — ABNORMAL HIGH (ref 70–99)
Glucose-Capillary: 182 mg/dL — ABNORMAL HIGH (ref 70–99)
Glucose-Capillary: 136 mg/dL — ABNORMAL HIGH (ref 70–99)

## 2021-05-24 MED ORDER — MAGNESIUM SULFATE 4 GM/100ML IV SOLN
4.0000 g | Freq: Once | INTRAVENOUS | Status: AC
  Administered 2021-05-24: 4 g via INTRAVENOUS
  Filled 2021-05-24: qty 100

## 2021-05-24 NOTE — Progress Notes (Addendum)
PROGRESS NOTE    Craig Jensen  [id number] DOB: September 04, 1930 DOA: 05/22/2021 PCP: Burnard Bunting, MD    Chief Complaint  Patient presents with   Extremity Weakness    Brief Narrative:  Patient is a pleasant 87 year old gentleman history of diabetes, hypertension, hyperlipidemia, hypothyroidism, peripheral neuropathy, prostate cancer presented to the ED with significant weakness fever and fatigue.  Work-up concerning for UTI as well as bacteremia.  Patient placed empirically on IV Rocephin   Assessment & Plan:   Principal Problem:   Sepsis secondary to UTI [facility]) Active Problems:   Malignant neoplasm of prostate ([place])   Hypokalemia   Pressure injury of skin   Cough   Acute cystitis with hematuria   Essential hypertension   Generalized anxiety disorder   Other hyperlipidemia   Primary hypothyroidism   Polyneuropathy   Type 2 diabetes mellitus without complications ([place])   Bacteremia   1 sepsis secondary to Klebsiella pneumonia bacteremia and Klebsiella pneumonia UTI -Patient presented with significant weakness, fever with a temp as high as 102.1 in the ED, increased respiratory rate of 30, leukocytosis with a white count of 20.6, lactic acid level of 2.1, urinalysis concerning for UTI.   -Fever curve trending down.  Leukocytosis trending down.   -Patient pancultured with blood cultures and urine cultures positive for Klebsiella pneumonia by [facility] ID.  Sensitivities pending. -Continue empiric IV Rocephin pending sensitivities.  2.  Hypokalemia/hypomagnesemia -Potassium at 3.7.  Magnesium at 1.6.   -Magnesium sulfate 4 g IV x1.   -Repeat labs in the morning.  3.  Noninsulin-dependent diabetes mellitus -Hemoglobin A1c 7.3(03/29/2021). -CBG 167 this morning.   -Continue to hold oral hypoglycemic agents  -SSI.    4.  Hypothyroidism -Continue home regimen Synthroid.  5.  Hypertension -Controlled on home regimen of Norvasc, metoprolol.  6.  History of prostate  cancer -Not on treatment at this time. -Outpatient follow-up with urology and oncology.  7.  Hyperlipidemia -Not on a statin. - outpatient follow-up with PCP.  8.  Generalized anxiety disorder -Hydroxyzine as needed.   DVT prophylaxis: Lovenox Code Status: Full Family Communication: Updated patient.  No family at bedside. Disposition:   Status is: Inpatient  Remains inpatient appropriate because:Inpatient level of care appropriate due to severity of illness  Dispo: The patient is from: Home              Anticipated d/c is to: Home              Patient currently is not medically stable to d/c.   Difficult to place patient No       Consultants:  None  Procedures:  Chest x-ray 05/22/2021  Antimicrobials:  IV Rocephin 05/22/2021>>>>>   Subjective: Sitting up in chair.  Overall feeling better.  Stated was able to ambulate in the hallway with a walker with the physical therapist today.  Denies any chest pain.  No shortness of breath.  No abdominal pain.    Objective: Vitals:   05/23/21 1820 05/23/21 2002 05/24/21 0455 05/24/21 0948  BP:  132/75 (!) 153/73 (!) 154/64  Pulse:  82 75 77  Resp:  18 18   Temp: (!) 100.4 F (38 C) 99 F (37.2 C) 98.3 F (36.8 C) 99.3 F (37.4 C)  TempSrc: Oral Oral Oral Oral  SpO2:  94% 98% 96%  Weight:      Height:        Intake/Output Summary (Last 24 hours) at 05/24/2021 1148 Last data filed at 05/24/2021 [id number]  Gross per 24 hour  Intake 1609.34 ml  Output 1100 ml  Net 509.34 ml    Filed Weights   05/22/21 0811  Weight: 102.1 kg    Examination:  General exam: : NAD Respiratory system: CTAB.  No wheezes, no rhonchi.  Speaking in full sentences.  Normal respiratory effort. Cardiovascular system: Regular rate and rhythm no murmurs rubs or gallops.  No JVD.  No lower extremity edema.  Gastrointestinal system: Abdomen soft, nontender, nondistended, positive bowel sounds.  No rebound.  No guarding. Central nervous system:  Alert and oriented. No focal neurological deficits. Extremities: Symmetric 5 x 5 power. Skin: No rashes, lesions or ulcers Psychiatry: Judgement and insight appear normal. Mood & affect appropriate.  Data Reviewed: I have personally reviewed following labs and imaging studies  CBC: Recent Labs  Lab 05/22/21 0910 05/22/21 1934 05/23/21 0528 05/24/21 0439  WBC 22.9* 20.6* 18.6* 13.1*  NEUTROABS 18.5*  --   --  10.5*  HGB 12.8* 11.3* 11.7* 10.6*  HCT 38.5* 33.3* 35.2* 32.0*  MCV 93.2 91.7 93.6 92.8  PLT 290 220 194 194     Basic Metabolic Panel: Recent Labs  Lab 05/22/21 0910 05/22/21 1934 05/23/21 0528 05/24/21 0439  NA 135  --  139 138  K 4.0  --  4.0 3.7  CL 102  --  106 106  CO2 23  --  25 24  GLUCOSE 204*  --  184* 238*  BUN 23  --  16 15  CREATININE 1.30* 0.95 0.90 0.79  CALCIUM 8.7*  --  8.8* 8.6*  MG  --   --   --  1.6*     GFR: Estimated Creatinine Clearance: 77.4 mL/min (by C-G formula based on SCr of 0.79 mg/dL).  Liver Function Tests: Recent Labs  Lab 05/22/21 0910 05/23/21 0528  AST 18 24  ALT 16 22  ALKPHOS 67 68  BILITOT 0.9 1.1  PROT 7.0 6.0*  ALBUMIN 3.3* 2.7*     CBG: Recent Labs  Lab 05/23/21 1109 05/23/21 1642 05/23/21 2004 05/24/21 0742 05/24/21 1112  GLUCAP 228* 146* 179* 167* 224*      Recent Results (from the past 240 hour(s))  Culture, blood (single)     Status: Abnormal (Preliminary result)   Collection Time: 05/22/21  9:10 AM   Specimen: Left Antecubital; Blood  Result Value Ref Range Status   Specimen Description   Final    LEFT ANTECUBITAL Performed at [facility], [address] [place], [place] [address]    Special Requests   Final    BOTTLES DRAWN AEROBIC AND ANAEROBIC Blood Culture adequate volume Performed at [facility], [address] [place], [place] [address]    Culture  Setup Time   Final    GRAM NEGATIVE RODS ANAEROBIC BOTTLE ONLY CRITICAL RESULT CALLED TO, READ BACK  BY AND VERIFIED WITH: PHARMD M.LILLISTON AT [id number] ON 05/23/2021 BY T.SAAD.    Culture (A)  Final    KLEBSIELLA PNEUMONIAE SUSCEPTIBILITIES TO FOLLOW Performed at [facility], [place] [address] [place], [place] [address]    Report Status PENDING  Incomplete  Blood Culture ID Panel (Reflexed)     Status: Abnormal   Collection Time: 05/22/21  9:10 AM  Result Value Ref Range Status   Enterococcus faecalis NOT DETECTED NOT DETECTED Final   Enterococcus Faecium NOT DETECTED NOT DETECTED Final   Listeria monocytogenes NOT DETECTED NOT DETECTED Final   Staphylococcus species NOT DETECTED  NOT DETECTED Final   Staphylococcus aureus (BCID) NOT DETECTED NOT DETECTED Final   Staphylococcus epidermidis NOT DETECTED NOT DETECTED Final   Staphylococcus lugdunensis NOT DETECTED NOT DETECTED Final   Streptococcus species NOT DETECTED NOT DETECTED Final   Streptococcus agalactiae NOT DETECTED NOT DETECTED Final   Streptococcus pneumoniae NOT DETECTED NOT DETECTED Final   Streptococcus pyogenes NOT DETECTED NOT DETECTED Final   A.calcoaceticus-baumannii NOT DETECTED NOT DETECTED Final   Bacteroides fragilis NOT DETECTED NOT DETECTED Final   Enterobacterales DETECTED (A) NOT DETECTED Final    Comment: Enterobacterales represent a large order of gram negative bacteria, not a single organism. CRITICAL RESULT CALLED TO, READ BACK BY AND VERIFIED WITH: PHARMD M.LILLISTON AT [id number] ON 05/23/2021 BY T.SAAD.    Enterobacter cloacae complex NOT DETECTED NOT DETECTED Final   Escherichia coli NOT DETECTED NOT DETECTED Final   Klebsiella aerogenes NOT DETECTED NOT DETECTED Final   Klebsiella oxytoca NOT DETECTED NOT DETECTED Final   Klebsiella pneumoniae DETECTED (A) NOT DETECTED Final    Comment: CRITICAL RESULT CALLED TO, READ BACK BY AND VERIFIED WITH: PHARMD M.LILLISTON AT [id number] ON 05/23/2021 BY T.SAAD.    Proteus species NOT DETECTED NOT DETECTED Final   Salmonella species NOT DETECTED NOT DETECTED Final    Serratia marcescens NOT DETECTED NOT DETECTED Final   Haemophilus influenzae NOT DETECTED NOT DETECTED Final   Neisseria meningitidis NOT DETECTED NOT DETECTED Final   Pseudomonas aeruginosa NOT DETECTED NOT DETECTED Final   Stenotrophomonas maltophilia NOT DETECTED NOT DETECTED Final   Candida albicans NOT DETECTED NOT DETECTED Final   Candida auris NOT DETECTED NOT DETECTED Final   Candida glabrata NOT DETECTED NOT DETECTED Final   Candida krusei NOT DETECTED NOT DETECTED Final   Candida parapsilosis NOT DETECTED NOT DETECTED Final   Candida tropicalis NOT DETECTED NOT DETECTED Final   Cryptococcus neoformans/gattii NOT DETECTED NOT DETECTED Final   CTX-M ESBL NOT DETECTED NOT DETECTED Final   Carbapenem resistance IMP NOT DETECTED NOT DETECTED Final   Carbapenem resistance KPC NOT DETECTED NOT DETECTED Final   Carbapenem resistance NDM NOT DETECTED NOT DETECTED Final   Carbapenem resist OXA 48 LIKE NOT DETECTED NOT DETECTED Final   Carbapenem resistance VIM NOT DETECTED NOT DETECTED Final    Comment: Performed at [facility], [address] [place], [place] [address]  Resp Panel by RT-PCR (Flu A&B, Covid) Nasopharyngeal Swab     Status: None   Collection Time: 05/22/21 10:05 AM   Specimen: Nasopharyngeal Swab; Nasopharyngeal(NP) swabs in vial transport medium  Result Value Ref Range Status   SARS Coronavirus 2 by RT PCR NEGATIVE NEGATIVE Final    Comment: (NOTE) SARS-CoV-2 target nucleic acids are NOT DETECTED.  The SARS-CoV-2 RNA is generally detectable in upper respiratory specimens during the acute phase of infection. The lowest concentration of SARS-CoV-2 viral copies this assay can detect is 138 copies/mL. A negative result does not preclude SARS-Cov-2 infection and should not be used as the sole basis for treatment or other patient management decisions. A negative result may occur with  improper specimen collection/handling, submission of specimen other than  nasopharyngeal swab, presence of viral mutation(s) within the areas targeted by this assay, and inadequate number of viral copies(<138 copies/mL). A negative result must be combined with clinical observations, patient history, and epidemiological information. The expected result is Negative.  Fact Sheet for Patients:  EntrepreneurPulse.com.au  Fact Sheet for Healthcare Providers:  IncredibleEmployment.be  This test is no t yet approved or cleared  by the [place] and  has been authorized for detection and/or diagnosis of SARS-CoV-2 by FDA under an Emergency Use Authorization (EUA). This EUA will remain  in effect (meaning this test can be used) for the duration of the COVID-19 declaration under Section 564(b)(1) of the Act, 21 U.S.C.section 360bbb-3(b)(1), unless the authorization is terminated  or revoked sooner.       Influenza A by PCR NEGATIVE NEGATIVE Final   Influenza B by PCR NEGATIVE NEGATIVE Final    Comment: (NOTE) The Xpert Xpress SARS-CoV-2/FLU/RSV plus assay is intended as an aid in the diagnosis of influenza from Nasopharyngeal swab specimens and should not be used as a sole basis for treatment. Nasal washings and aspirates are unacceptable for Xpert Xpress SARS-CoV-2/FLU/RSV testing.  Fact Sheet for Patients: EntrepreneurPulse.com.au  Fact Sheet for Healthcare Providers: IncredibleEmployment.be  This test is not yet approved or cleared by the [place] FDA and has been authorized for detection and/or diagnosis of SARS-CoV-2 by FDA under an Emergency Use Authorization (EUA). This EUA will remain in effect (meaning this test can be used) for the duration of the COVID-19 declaration under Section 564(b)(1) of the Act, 21 U.S.C. section 360bbb-3(b)(1), unless the authorization is terminated or revoked.  Performed at [facility], [address] [place], [place]  [address]   Urine Culture     Status: Abnormal (Preliminary result)   Collection Time: 05/22/21 11:05 AM   Specimen: Urine, Catheterized  Result Value Ref Range Status   Specimen Description   Final    URINE, CATHETERIZED Performed at [facility], [place] [address] [place], [place] [address]    Special Requests   Final    NONE Performed at [facility], [place] [address], [place], [place] [address]    Culture (A)  Final    >=100,000 COLONIES/mL GRAM NEGATIVE RODS CULTURE REINCUBATED FOR BETTER GROWTH SUSCEPTIBILITIES TO FOLLOW Performed at [facility], [place] [address] [place], [place] [address]    Report Status PENDING  Incomplete          Radiology Studies: No results found.      Scheduled Meds:  amLODipine  5 mg Oral Daily   darifenacin  7.5 mg Oral Daily   enoxaparin (LOVENOX) injection  40 mg Subcutaneous Q24H   influenza vaccine adjuvanted  0.5 mL Intramuscular Tomorrow-1000   insulin aspart  0-15 Units Subcutaneous TID WC   insulin aspart  0-5 Units Subcutaneous QHS   levothyroxine  50 mcg Oral QAC breakfast   mouth rinse  15 mL Mouth Rinse BID   metoprolol succinate  25 mg Oral Daily   Continuous Infusions:  cefTRIAXone (ROCEPHIN)  IV 2 g (05/23/21 1323)   lactated ringers 100 mL/hr at 05/24/21 0100   magnesium sulfate bolus IVPB 4 g (05/24/21 1040)     LOS: 2 days    Time spent: 35 minutes    Irine Seal, MD [facility]   To contact the attending provider between 7A-7P or the covering provider during after hours 7P-7A, please log into the web site [email or web address] and access using universal [place] password for that web site. If you do not have the password, please call the hospital operator.  05/24/2021, 11:48 AM

## 2021-05-24 NOTE — Plan of Care (Signed)
  Problem: Clinical Measurements: Goal: Respiratory complications will improve Outcome: Progressing   Problem: Nutrition: Goal: Adequate nutrition will be maintained Outcome: Progressing   Problem: Skin Integrity: Goal: Risk for impaired skin integrity will decrease Outcome: Progressing   

## 2021-05-24 NOTE — Progress Notes (Signed)
Mobility Specialist - Progress Note    05/24/21 1522  Mobility  Activity Ambulated in hall;Ambulated in room  Level of Assistance Minimal assist, patient does 75% or more  Assistive Device Front wheel walker  Distance Ambulated (ft) 300 ft  Mobility Ambulated with assistance in hallway;Ambulated with assistance in room  Mobility Response Tolerated well  Mobility performed by Mobility specialist  $Mobility charge 1 Mobility   Upon entry pt was eager to ambulate and stated he hopes to return to baseline soon. Pt required Min A to stand from recliner. Once up pt experienced 1 LOB episode and stated he felt wobbly before sitting back down. Pt stood from recliner once more and required extra time to steady himself and practiced marching in place before beginning ambulation. Pt ambulated 300 ft in hallway using RW and took 3 standing rest breaks during session to practice pursed breathing and marching in place. Pt attempted to stand without RW during breaks to practice balance before returning to room. Once in room, pt practiced walking without RW and managed to take ~10 steps without experiencing LOB. At end of session, he was returned to recliner and completed 10 leg raises on each leg and 20 ankle pumps. Before leaving, he requested for PT and mobility specialist to see him each day of hospitalization. Pt was then left in chair with chair alarm on, call bell at side, family and chaplin in room. RN informed of session.  [place] Specialist Acute Rehabilitation Services Phone: [phone] 05/24/21, 3:32 PM

## 2021-05-24 NOTE — Evaluation (Signed)
Physical Therapy Evaluation Patient Details Name: Craig Jensen MRN: [id number] DOB: [birth year] Today's Date: 05/24/2021  Clinical Impression  Pt is an 87 y.o. male with below HPI. Pt reports he is independent at baseline without use of AD, recently began to use RW on Sunday due to LE weakness. Pt required up to MOD A without use of RW due to increased unsteadiness without AD, demonstrated improved stability with use of RW requiring MIN A progressing to MIN guard for safety. Pt presents today below baseline mobility level- currently limited by fatigue/LE weakness and balance deficits. PT educated pt on use of RW for mobility at this time to maximize safety and for fall prevention, pt verbalized understanding. Pt lives with his wife who is available 24/7 and has son who lives nearby. Recommend HHPT and supervision for all mobility/OOB to maintain pt safety. Pt will benefit from continued skilled PT to increase their independence and maximize safety with mobility.       Recommendations for follow up therapy are one component of a multi-disciplinary discharge planning process, led by the attending physician.  Recommendations may be updated based on patient status, additional functional criteria and insurance authorization.    05/24/21 1000  PT Visit Information  Last PT Received On 05/24/21  Assistance Needed +1  History of Present Illness Pt is an 88 y.o. male presented to the ED with significant weakness fever and fatigue. Admitted for sepsis secondary to UTI. PMH significant for diabetes, hypertension, hyperlipidemia, hypothyroidism, peripheral neuropathy, prostate cancer  Precautions  Precautions None  Restrictions  Weight Bearing Restrictions No  Home Living  Family/patient expects to be discharged to: Private residence  Living Arrangements Spouse/significant other  Available Help at Discharge Family;Available 24 hours/day  Type of Home Independent living facility ([facility])   Home Access Level entry  Home Layout One level  Bathroom Shower/Tub Tub/shower unit  Bathroom Toilet Handicapped height  Bathroom Accessibility Yes  Home Equipment Walker - 2 wheels;Cane - single point;Grab bars - tub/shower;Grab bars - toilet;Shower seat - built in  Additional Comments lives with wife. Son lives in [place].  Prior Function  Level of Independence Independent  Comments (I) without AD prior admission began to use RW on sunday with onset of LE weakness; reports x1 fall in July due to LE weakness  Communication  Communication No difficulties  Pain Assessment  Pain Assessment No/denies pain  Cognition  Arousal/Alertness Awake/alert  Behavior During Therapy WFL for tasks assessed/performed  Overall Cognitive Status Within Functional Limits for tasks assessed  Upper Extremity Assessment  Upper Extremity Assessment Overall WFL for tasks assessed  Lower Extremity Assessment  Lower Extremity Assessment Generalized weakness (not formally assessed, decreased muscular endurance with functional tasks)  Cervical / Trunk Assessment  Cervical / Trunk Assessment Normal  Bed Mobility  General bed mobility comments Pt OOB in recliner upon entry  Transfers  Overall transfer level Needs assistance  Equipment used None  Transfers Sit to/from Stand  Sit to Stand Min assist  General transfer comment pt with unsuccessful stand attempts x3 from recliner chair with use of B UEs for attempts for power up, ultmialtely requiring added MIN A from therapist for assist with power up to stand. MIN A provided in standing for stability/balance, pt initially reaching out for UE support.  Ambulation/Gait  Ambulation/Gait assistance Mod assist;Min assist;Min guard  Gait Distance (Feet) 80 Feet  Assistive device Rolling walker (2 wheeled);None  Gait Pattern/deviations Step-to pattern;Decreased stride length  General Gait Details Trialed ambulation without use  of RW for ~36f total during session, pt  unsteady with x2 stagger steps and decreased gait speed and stride length requiring MIN-MOD A from PT for stability/balance. Pt with improved stability and gait speed with use of RW progressing to MIN A -MIN guard for safety with cues for close proximity to RW to maximize safety. Pt limited with further distance by LE weakness/fatigue. RPE 5/10.  Gait velocity decr  Balance  Overall balance assessment Needs assistance  Sitting-balance support Feet supported  Sitting balance-Leahy Scale Good  Standing balance support Bilateral upper extremity supported;No upper extremity supported  Standing balance-Leahy Scale Fair  PT - End of Session  Equipment Utilized During Treatment Gait belt  Activity Tolerance Patient tolerated treatment well;Patient limited by fatigue  Patient left in chair;with call bell/phone within reach;with chair alarm set;with nursing/sitter in room  Nurse Communication Mobility status  PT Assessment  PT Recommendation/Assessment Patient needs continued PT services  PT Visit Diagnosis Unsteadiness on feet (R26.81);Muscle weakness (generalized) (M62.81)  PT Problem List Decreased strength;Decreased activity tolerance;Decreased balance;Decreased mobility;Decreased knowledge of use of DME  PT Plan  PT Frequency (ACUTE ONLY) Min 3X/week  PT Treatment/Interventions (ACUTE ONLY) DME instruction;Gait training;Functional mobility training;Therapeutic activities;Therapeutic exercise;Balance training;Patient/family education  AM-PAC PT "6 Clicks" Mobility Outcome Measure (Version 2)  Help needed turning from your back to your side while in a flat bed without using bedrails? 3  Help needed moving from lying on your back to sitting on the side of a flat bed without using bedrails? 3  Help needed moving to and from a bed to a chair (including a wheelchair)? 3  Help needed standing up from a chair using your arms (e.g., wheelchair or bedside chair)? 3  Help needed to walk in hospital room? 3   Help needed climbing 3-5 steps with a railing?  2  6 Click Score 17  Consider [facility]ation [facility]  What is the highest level of mobility based on the progressive mobility assessment? Level 5 (Walks with assist in room/hall) - Balance while stepping forward/back and can walk in room with assist - Complete  Mobility Ambulated with assistance in hallway  PT Recommendation  Follow Up Recommendations Home health PT;Supervision for mobility/OOB  PT equipment None recommended by PT (pt owns RW)  Individuals Consulted  Consulted and Agree with Results and Recommendations Patient  Acute Rehab PT Goals  Patient Stated Goal Go back home and continue therapy to get stronger again  PT Goal Formulation With patient  Time For Goal Achievement 06/07/21  Potential to Achieve Goals Good  PT Time Calculation  PT Start Time (ACUTE ONLY) 1014  PT Stop Time (ACUTE ONLY) 1043  PT Time Calculation (min) (ACUTE ONLY) 29 min  PT General Charges  $$ ACUTE PT VISIT 1 Visit  PT Evaluation  $PT Eval Low Complexity 1 Low  PT Treatments  $Gait Training 8-22 mins       LFestus BarrenPT, DPT  Acute Rehabilitation Services  Office [phone]  05/24/2021, 2:47 PM

## 2021-05-25 DIAGNOSIS — N390 Urinary tract infection, site not specified: Secondary | ICD-10-CM | POA: Diagnosis not present

## 2021-05-25 DIAGNOSIS — A419 Sepsis, unspecified organism: Secondary | ICD-10-CM | POA: Diagnosis not present

## 2021-05-25 LAB — CBC WITH DIFFERENTIAL/PLATELET
Abs Immature Granulocytes: 0.12 10*3/uL — ABNORMAL HIGH (ref 0.00–0.07)
Basophils Absolute: 0.1 10*3/uL (ref 0.0–0.1)
Basophils Relative: 1 %
Eosinophils Absolute: 0.2 10*3/uL (ref 0.0–0.5)
Eosinophils Relative: 2 %
HCT: 30.1 % — ABNORMAL LOW (ref 39.0–52.0)
Hemoglobin: 10.2 g/dL — ABNORMAL LOW (ref 13.0–17.0)
Immature Granulocytes: 1 %
Lymphocytes Relative: 8 %
Lymphs Abs: 0.8 10*3/uL (ref 0.7–4.0)
MCH: 31.1 pg (ref 26.0–34.0)
MCHC: 33.9 g/dL (ref 30.0–36.0)
MCV: 91.8 fL (ref 80.0–100.0)
Monocytes Absolute: 1.3 10*3/uL — ABNORMAL HIGH (ref 0.1–1.0)
Monocytes Relative: 13 %
Neutro Abs: 7.4 10*3/uL (ref 1.7–7.7)
Neutrophils Relative %: 75 %
Platelets: 204 10*3/uL (ref 150–400)
RBC: 3.28 MIL/uL — ABNORMAL LOW (ref 4.22–5.81)
RDW: 13.1 % (ref 11.5–15.5)
WBC: 9.9 10*3/uL (ref 4.0–10.5)
nRBC: 0 % (ref 0.0–0.2)

## 2021-05-25 LAB — URINE CULTURE: Culture: >=100000 — AB

## 2021-05-25 LAB — CULTURE, BLOOD (SINGLE): Special Requests: ADEQUATE

## 2021-05-25 LAB — BASIC METABOLIC PANEL
Anion gap: 8 (ref 5–15)
BUN: 15 mg/dL (ref 8–23)
CO2: 24 mmol/L (ref 22–32)
Calcium: 8.2 mg/dL — ABNORMAL LOW (ref 8.9–10.3)
Chloride: 105 mmol/L (ref 98–111)
Creatinine, Ser: 0.92 mg/dL (ref 0.61–1.24)
GFR, Estimated: >60 mL/min (ref >60)
Glucose, Bld: 175 mg/dL — ABNORMAL HIGH (ref 70–99)
Potassium: 3.7 mmol/L (ref 3.5–5.1)
Sodium: 137 mmol/L (ref 135–145)

## 2021-05-25 LAB — GLUCOSE, CAPILLARY
Glucose-Capillary: 170 mg/dL — ABNORMAL HIGH (ref 70–99)
Glucose-Capillary: 192 mg/dL — ABNORMAL HIGH (ref 70–99)
Glucose-Capillary: 175 mg/dL — ABNORMAL HIGH (ref 70–99)

## 2021-05-25 LAB — MAGNESIUM: Magnesium: 2 mg/dL (ref 1.7–2.4)

## 2021-05-25 MED ORDER — SACCHAROMYCES BOULARDII 250 MG PO CAPS
250.0000 mg | ORAL_CAPSULE | Freq: Two times a day (BID) | ORAL | Status: DC
  Administered 2021-05-25 – 2021-05-27 (×5): 250 mg via ORAL
  Filled 2021-05-25 (×5): qty 1

## 2021-05-25 MED ORDER — CEFAZOLIN SODIUM-DEXTROSE 2-4 GM/100ML-% IV SOLN
2.0000 g | Freq: Three times a day (TID) | INTRAVENOUS | Status: DC
  Administered 2021-05-25 – 2021-05-26 (×4): 2 g via INTRAVENOUS
  Filled 2021-05-25 (×4): qty 100

## 2021-05-25 MED ORDER — POTASSIUM CHLORIDE CRYS ER 20 MEQ PO TBCR
40.0000 meq | EXTENDED_RELEASE_TABLET | Freq: Once | ORAL | Status: AC
  Administered 2021-05-25: 40 meq via ORAL
  Filled 2021-05-25: qty 2

## 2021-05-25 NOTE — Progress Notes (Addendum)
PROGRESS NOTE    Craig Jensen  [id number] DOB: 12-02-1929 DOA: 05/22/2021 PCP: Burnard Bunting, MD   Brief Narrative: 87 year old with past medical history significant for diabetes, hypertension, hyperlipidemia, hypothyroidism, peripheral neuropathy, prostate cancer presented to the ED with significant weakness, fever and fatigue.  Work-up concerning for UTI as well as bacteremia.  Patient was a started on IV Rocephin. Patient was diagnosed with Klebsiella pneumonia bacteremia and Klebsiella pneumonia UTI.  Antibiotic was changed to Ancef on 9/16.  Plan to repeat blood cultures.  Assessment & Plan:   Principal Problem:   Sepsis secondary to UTI [facility]) Active Problems:   Malignant neoplasm of prostate ([place])   Hypokalemia   Pressure injury of skin   Cough   Acute cystitis with hematuria   Essential hypertension   Generalized anxiety disorder   Other hyperlipidemia   Primary hypothyroidism   Polyneuropathy   Type 2 diabetes mellitus without complications ([place])   Bacteremia   1-Sepsis secondary to Klebsiella pneumonia bacteremia and Klebsiella pneumonia UTI: -Patient presented with fever, temperature 102, tachypneic respiration rate 30, leukocytosis, lactic acid 2.1.  Urinalysis concerning for UTI. -Cultures positive for Klebsiella pneumonia, sensitive to Ancef. -Antibiotics changed to IV Ancef.  He received ceftriaxone from 9/13 until 9/16. -Plan to repeat blood cultures today.  2-Hypokalemia/Hypomagnesemia;  Replaced.   GI/ Watery stool;  Denies abdominal pain. No leukocytosis.  Suspect related to antibiotics.  Start florastore.   DM  A1c 7.3 SSI  Hypothyroidism;  Continue with Synthroid  HTN;  Continue with Norvasc and metoprolol.   History of prostate cancer HLD; not on statins. Needs to follow up with PCP.  Generalized anxiety;  Hydroxyzine PRN.    Pressure Injury 05/22/21 Buttocks Mid Stage 2 -  Partial thickness loss of dermis presenting as a  shallow open injury with a red, pink wound bed without slough. (Active)  05/22/21 1857  Location: Buttocks  Location Orientation: Mid  Staging: Stage 2 -  Partial thickness loss of dermis presenting as a shallow open injury with a red, pink wound bed without slough.  Wound Description (Comments):   Present on Admission: Yes     Estimated body mass index is 30.52 kg/m as calculated from the following:   Height as of this encounter: 6' (1.829 m).   Weight as of this encounter: 102.1 kg.   DVT prophylaxis: Lovenox Code Status: Full code Family Communication: Care discussed with patient Disposition Plan:  Status is: Inpatient  Remains inpatient appropriate because:IV treatments appropriate due to intensity of illness or inability to take PO  Dispo: The patient is from: Home              Anticipated d/c is to: Home              Patient currently is not medically stable to d/c.   Difficult to place patient No     Consultants:  None  Procedures:  None  Antimicrobials:  Rocephin 9/13---9/16 cefazolin 9/16  Subjective: He is not feeling ready to go home today. He report two watery stool, one yesterday and one today. Denies abdominal pain.  He is still feel weak in his leg.    Objective: Vitals:   05/24/21 1212 05/24/21 1900 05/25/21 0515 05/25/21 1124  BP: 135/70 (!) 148/79 (!) 152/73 132/65  Pulse: 70 72 64 62  Resp: '18 18 18 20  '$ Temp: 99.3 F (37.4 C) 99.8 F (37.7 C) 99.3 F (37.4 C) 97.8 F (36.6 C)  TempSrc: Oral Oral  SpO2: 94% 92% 94% 95%  Weight:      Height:        Intake/Output Summary (Last 24 hours) at 05/25/2021 1549 Last data filed at 05/25/2021 1300 Gross per 24 hour  Intake 2328.16 ml  Output 1700 ml  Net 628.16 ml   Filed Weights   05/22/21 0811  Weight: 102.1 kg    Examination:  General exam: Appears calm and comfortable  Respiratory system: Clear to auscultation. Respiratory effort normal. Cardiovascular system: S1 & S2 heard,  RRR.  Gastrointestinal system: Abdomen is nondistended, soft and nontender. No organomegaly or masses felt. Normal bowel sounds heard. Central nervous system: Alert and oriented. Extremities: Symmetric 5 x 5 power.   Data Reviewed: I have personally reviewed following labs and imaging studies  CBC: Recent Labs  Lab 05/22/21 0910 05/22/21 1934 05/23/21 0528 05/24/21 0439 05/25/21 0428  WBC 22.9* 20.6* 18.6* 13.1* 9.9  NEUTROABS 18.5*  --   --  10.5* 7.4  HGB 12.8* 11.3* 11.7* 10.6* 10.2*  HCT 38.5* 33.3* 35.2* 32.0* 30.1*  MCV 93.2 91.7 93.6 92.8 91.8  PLT 290 220 194 194 0000000   Basic Metabolic Panel: Recent Labs  Lab 05/22/21 0910 05/22/21 1934 05/23/21 0528 05/24/21 0439 05/25/21 0428  NA 135  --  139 138 137  K 4.0  --  4.0 3.7 3.7  CL 102  --  106 106 105  CO2 23  --  '25 24 24  '$ GLUCOSE 204*  --  184* 238* 175*  BUN 23  --  '16 15 15  '$ CREATININE 1.30* 0.95 0.90 0.79 0.92  CALCIUM 8.7*  --  8.8* 8.6* 8.2*  MG  --   --   --  1.6* 2.0   GFR: Estimated Creatinine Clearance: 67.3 mL/min (by C-G formula based on SCr of 0.92 mg/dL). Liver Function Tests: Recent Labs  Lab 05/22/21 0910 05/23/21 0528  AST 18 24  ALT 16 22  ALKPHOS 67 68  BILITOT 0.9 1.1  PROT 7.0 6.0*  ALBUMIN 3.3* 2.7*   No results for input(s): LIPASE, AMYLASE in the last 168 hours. No results for input(s): AMMONIA in the last 168 hours. Coagulation Profile: Recent Labs  Lab 05/23/21 0528 05/24/21 0852  INR 4.8* 1.1   Cardiac Enzymes: No results for input(s): CKTOTAL, CKMB, CKMBINDEX, TROPONINI in the last 168 hours. BNP (last 3 results) No results for input(s): PROBNP in the last 8760 hours. HbA1C: No results for input(s): HGBA1C in the last 72 hours. CBG: Recent Labs  Lab 05/24/21 1112 05/24/21 1604 05/24/21 2043 05/25/21 0740 05/25/21 1122  GLUCAP 224* 182* 136* 170* 192*   Lipid Profile: No results for input(s): CHOL, HDL, LDLCALC, TRIG, CHOLHDL, LDLDIRECT in the last 72  hours. Thyroid Function Tests: No results for input(s): TSH, T4TOTAL, FREET4, T3FREE, THYROIDAB in the last 72 hours. Anemia Panel: No results for input(s): VITAMINB12, FOLATE, FERRITIN, TIBC, IRON, RETICCTPCT in the last 72 hours. Sepsis Labs: Recent Labs  Lab 05/22/21 0910 05/22/21 1456 05/23/21 0528  PROCALCITON  --   --  0.99  LATICACIDVEN 2.1* 2.0*  --     Recent Results (from the past 240 hour(s))  Culture, blood (single)     Status: Abnormal   Collection Time: 05/22/21  9:10 AM   Specimen: Left Antecubital; Blood  Result Value Ref Range Status   Specimen Description   Final    LEFT ANTECUBITAL Performed at [facility], [address], [place], [place] [address]  Special Requests   Final    BOTTLES DRAWN AEROBIC AND ANAEROBIC Blood Culture adequate volume Performed at [facility], [address] [place], [place] [address]    Culture  Setup Time   Final    GRAM NEGATIVE RODS ANAEROBIC BOTTLE ONLY CRITICAL RESULT CALLED TO, READ BACK BY AND VERIFIED WITH: PHARMD M.LILLISTON AT [id number] ON 05/23/2021 BY T.SAAD. Performed at [facility], [place] [address] [place], [place] [address]    Culture KLEBSIELLA PNEUMONIAE (A)  Final   Report Status 05/25/2021 FINAL  Final   Organism ID, Bacteria KLEBSIELLA PNEUMONIAE  Final      Susceptibility   Klebsiella pneumoniae - MIC*    AMPICILLIN >=32 RESISTANT Resistant     CEFAZOLIN <=4 SENSITIVE Sensitive     CEFEPIME <=0.12 SENSITIVE Sensitive     CEFTAZIDIME <=1 SENSITIVE Sensitive     CEFTRIAXONE <=0.25 SENSITIVE Sensitive     CIPROFLOXACIN <=0.25 SENSITIVE Sensitive     GENTAMICIN <=1 SENSITIVE Sensitive     IMIPENEM <=0.25 SENSITIVE Sensitive     TRIMETH/SULFA <=20 SENSITIVE Sensitive     AMPICILLIN/SULBACTAM 4 SENSITIVE Sensitive     PIP/TAZO <=4 SENSITIVE Sensitive     * KLEBSIELLA PNEUMONIAE  Blood Culture ID Panel (Reflexed)     Status: Abnormal   Collection Time: 05/22/21  9:10 AM  Result  Value Ref Range Status   Enterococcus faecalis NOT DETECTED NOT DETECTED Final   Enterococcus Faecium NOT DETECTED NOT DETECTED Final   Listeria monocytogenes NOT DETECTED NOT DETECTED Final   Staphylococcus species NOT DETECTED NOT DETECTED Final   Staphylococcus aureus (BCID) NOT DETECTED NOT DETECTED Final   Staphylococcus epidermidis NOT DETECTED NOT DETECTED Final   Staphylococcus lugdunensis NOT DETECTED NOT DETECTED Final   Streptococcus species NOT DETECTED NOT DETECTED Final   Streptococcus agalactiae NOT DETECTED NOT DETECTED Final   Streptococcus pneumoniae NOT DETECTED NOT DETECTED Final   Streptococcus pyogenes NOT DETECTED NOT DETECTED Final   A.calcoaceticus-baumannii NOT DETECTED NOT DETECTED Final   Bacteroides fragilis NOT DETECTED NOT DETECTED Final   Enterobacterales DETECTED (A) NOT DETECTED Final    Comment: Enterobacterales represent a large order of gram negative bacteria, not a single organism. CRITICAL RESULT CALLED TO, READ BACK BY AND VERIFIED WITH: PHARMD M.LILLISTON AT [id number] ON 05/23/2021 BY T.SAAD.    Enterobacter cloacae complex NOT DETECTED NOT DETECTED Final   Escherichia coli NOT DETECTED NOT DETECTED Final   Klebsiella aerogenes NOT DETECTED NOT DETECTED Final   Klebsiella oxytoca NOT DETECTED NOT DETECTED Final   Klebsiella pneumoniae DETECTED (A) NOT DETECTED Final    Comment: CRITICAL RESULT CALLED TO, READ BACK BY AND VERIFIED WITH: PHARMD M.LILLISTON AT [id number] ON 05/23/2021 BY T.SAAD.    Proteus species NOT DETECTED NOT DETECTED Final   Salmonella species NOT DETECTED NOT DETECTED Final   Serratia marcescens NOT DETECTED NOT DETECTED Final   Haemophilus influenzae NOT DETECTED NOT DETECTED Final   Neisseria meningitidis NOT DETECTED NOT DETECTED Final   Pseudomonas aeruginosa NOT DETECTED NOT DETECTED Final   Stenotrophomonas maltophilia NOT DETECTED NOT DETECTED Final   Candida albicans NOT DETECTED NOT DETECTED Final   Candida auris NOT  DETECTED NOT DETECTED Final   Candida glabrata NOT DETECTED NOT DETECTED Final   Candida krusei NOT DETECTED NOT DETECTED Final   Candida parapsilosis NOT DETECTED NOT DETECTED Final   Candida tropicalis NOT DETECTED NOT DETECTED Final   Cryptococcus neoformans/gattii NOT DETECTED NOT DETECTED Final   CTX-M  ESBL NOT DETECTED NOT DETECTED Final   Carbapenem resistance IMP NOT DETECTED NOT DETECTED Final   Carbapenem resistance KPC NOT DETECTED NOT DETECTED Final   Carbapenem resistance NDM NOT DETECTED NOT DETECTED Final   Carbapenem resist OXA 48 LIKE NOT DETECTED NOT DETECTED Final   Carbapenem resistance VIM NOT DETECTED NOT DETECTED Final    Comment: Performed at [facility], [place] [address] [place], [place] [address]  Resp Panel by RT-PCR (Flu A&B, Covid) Nasopharyngeal Swab     Status: None   Collection Time: 05/22/21 10:05 AM   Specimen: Nasopharyngeal Swab; Nasopharyngeal(NP) swabs in vial transport medium  Result Value Ref Range Status   SARS Coronavirus 2 by RT PCR NEGATIVE NEGATIVE Final    Comment: (NOTE) SARS-CoV-2 target nucleic acids are NOT DETECTED.  The SARS-CoV-2 RNA is generally detectable in upper respiratory specimens during the acute phase of infection. The lowest concentration of SARS-CoV-2 viral copies this assay can detect is 138 copies/mL. A negative result does not preclude SARS-Cov-2 infection and should not be used as the sole basis for treatment or other patient management decisions. A negative result may occur with  improper specimen collection/handling, submission of specimen other than nasopharyngeal swab, presence of viral mutation(s) within the areas targeted by this assay, and inadequate number of viral copies(<138 copies/mL). A negative result must be combined with clinical observations, patient history, and epidemiological information. The expected result is Negative.  Fact Sheet for Patients:   EntrepreneurPulse.com.au  Fact Sheet for Healthcare Providers:  IncredibleEmployment.be  This test is no t yet approved or cleared by the [place] FDA and  has been authorized for detection and/or diagnosis of SARS-CoV-2 by FDA under an Emergency Use Authorization (EUA). This EUA will remain  in effect (meaning this test can be used) for the duration of the COVID-19 declaration under Section 564(b)(1) of the Act, 21 U.S.C.section 360bbb-3(b)(1), unless the authorization is terminated  or revoked sooner.       Influenza A by PCR NEGATIVE NEGATIVE Final   Influenza B by PCR NEGATIVE NEGATIVE Final    Comment: (NOTE) The Xpert Xpress SARS-CoV-2/FLU/RSV plus assay is intended as an aid in the diagnosis of influenza from Nasopharyngeal swab specimens and should not be used as a sole basis for treatment. Nasal washings and aspirates are unacceptable for Xpert Xpress SARS-CoV-2/FLU/RSV testing.  Fact Sheet for Patients: EntrepreneurPulse.com.au  Fact Sheet for Healthcare Providers: IncredibleEmployment.be  This test is not yet approved or cleared by the [place] FDA and has been authorized for detection and/or diagnosis of SARS-CoV-2 by FDA under an Emergency Use Authorization (EUA). This EUA will remain in effect (meaning this test can be used) for the duration of the COVID-19 declaration under Section 564(b)(1) of the Act, 21 U.S.C. section 360bbb-3(b)(1), unless the authorization is terminated or revoked.  Performed at [facility], [address] [place], [place] [address]   Urine Culture     Status: Abnormal   Collection Time: 05/22/21 11:05 AM   Specimen: Urine, Catheterized  Result Value Ref Range Status   Specimen Description   Final    URINE, CATHETERIZED Performed at [facility] [address], [place], [place] [address]    Special Requests   Final     NONE Performed at [facility], [place] [address] [place], [place] [address]    Culture >=100,000 COLONIES/mL KLEBSIELLA PNEUMONIAE (A)  Final   Report Status 05/25/2021 FINAL  Final  Organism ID, Bacteria KLEBSIELLA PNEUMONIAE (A)  Final      Susceptibility   Klebsiella pneumoniae - MIC*    AMPICILLIN >=32 RESISTANT Resistant     CEFAZOLIN <=4 SENSITIVE Sensitive     CEFEPIME <=0.12 SENSITIVE Sensitive     CEFTRIAXONE <=0.25 SENSITIVE Sensitive     CIPROFLOXACIN <=0.25 SENSITIVE Sensitive     GENTAMICIN <=1 SENSITIVE Sensitive     IMIPENEM <=0.25 SENSITIVE Sensitive     NITROFURANTOIN <=16 SENSITIVE Sensitive     TRIMETH/SULFA <=20 SENSITIVE Sensitive     AMPICILLIN/SULBACTAM 4 SENSITIVE Sensitive     PIP/TAZO <=4 SENSITIVE Sensitive     * >=100,000 COLONIES/mL KLEBSIELLA PNEUMONIAE         Radiology Studies: No results found.      Scheduled Meds:  amLODipine  5 mg Oral Daily   darifenacin  7.5 mg Oral Daily   enoxaparin (LOVENOX) injection  40 mg Subcutaneous Q24H   influenza vaccine adjuvanted  0.5 mL Intramuscular Tomorrow-1000   insulin aspart  0-15 Units Subcutaneous TID WC   insulin aspart  0-5 Units Subcutaneous QHS   levothyroxine  50 mcg Oral QAC breakfast   mouth rinse  15 mL Mouth Rinse BID   metoprolol succinate  25 mg Oral Daily   saccharomyces boulardii  250 mg Oral BID   Continuous Infusions:   ceFAZolin (ANCEF) IV 2 g (05/25/21 1445)     LOS: 3 days    Time spent: 35 minutes.     Elmarie Shiley, MD [facility]   If 7PM-7AM, please contact night-coverage [email or web address]  05/25/2021, 3:49 PM

## 2021-05-25 NOTE — Evaluation (Signed)
Occupational Therapy Evaluation Patient Details Name: Craig Jensen MRN: [id number] DOB: 02-Feb-1930 Today's Date: 05/25/2021   History of Present Illness Pt is an 87 y.o. male presented to the ED with significant weakness fever and fatigue. Admitted for sepsis secondary to UTI. PMH significant for diabetes, hypertension, hyperlipidemia, hypothyroidism, peripheral neuropathy, prostate cancer   Clinical Impression   Mr. Craig Jensen is an 87 year old man who presents with lower extremity weakness, decreased activity tolerance, impaired balance and decreased sensation in bilateral feet. Patient is able to physically perform ADLs just needing min guard due to impaired balance. Patient does well with RW with no overt loss of balance. However, without walker patient unsteady and gait impaired and he's at a high risk for falls. Patient reports pain in sacrum due to prolonged sitting. Patient demonstrated ability to stand from chair with bed rail support to offload buttocks for comfort. Patient exhibited weak hip strength with testing and demonstrated difficulty with sit to and and therapist instructed patient on sitting exercises to perform (seated marching and pillow squeezes) as well as some home safety tips. Patient overall does not have any further OT needs and will defer discharge plan and activity to PT.      Recommendations for follow up therapy are one component of a multi-disciplinary discharge planning process, led by the attending physician.  Recommendations may be updated based on patient status, additional functional criteria and insurance authorization.   Follow Up Recommendations  No OT follow up    Equipment Recommendations  None recommended by OT    Recommendations for Other Services       Precautions / Restrictions Precautions Precautions: Fall Restrictions Weight Bearing Restrictions: No      Mobility Bed Mobility                    Transfers Overall  transfer level: Needs assistance Equipment used: Rolling walker (2 wheeled) Transfers: Sit to/from Stand Sit to Stand: Min guard         General transfer comment: MIn guard to ambualte in hall and to bathroom with RW. When walker taken away becomes quite unsteady - hx of neuropathy in feet - and is unable to tolerate a nudge without a posterior loss of balance. Patient's wife reports patient's posture more "bent" than normal.    Balance Overall balance assessment: Needs assistance Sitting-balance support: No upper extremity supported Sitting balance-Leahy Scale: Normal     Standing balance support: During functional activity Standing balance-Leahy Scale: Fair Standing balance comment: reliant on at least one upper extremity support for dynamic standing / movement                           ADL either performed or assessed with clinical judgement   ADL Overall ADL's : Needs assistance/impaired Eating/Feeding: Independent   Grooming: Standing;Supervision/safety   Upper Body Bathing: Set up;Sitting   Lower Body Bathing: Set up;Sitting/lateral leans   Upper Body Dressing : Set up;Sitting   Lower Body Dressing: Set up;Sit to/from stand;Min guard   Toilet Transfer: Min guard;Regular Toilet;Grab bars;RW;Ambulation   Toileting- Water quality scientist and Hygiene: Supervision/safety;Sitting/lateral lean;Sit to/from stand               Vision Baseline Vision/History: 1 Wears glasses Patient Visual Report: No change from baseline       Perception     Praxis      Pertinent Vitals/Pain Pain Assessment: No/denies pain  Hand Dominance Right   Extremity/Trunk Assessment Upper Extremity Assessment Upper Extremity Assessment: LUE deficits/detail;RUE deficits/detail RUE Deficits / Details: WFL ROM (hx of rTSA), 4/5 shoulder strength, 5/5 elbow, 5/5 wrist, 5/5 grip RUE Sensation: WNL RUE Coordination: WNL LUE Deficits / Details: Grossly functional shoulder  ROM, 3+/5 shoulder strength, 5/5 elbow, 5/5 wrist, 5/5 grip LUE Sensation: WNL LUE Coordination: WNL   Lower Extremity Assessment Lower Extremity Assessment: Defer to PT evaluation (hx of neuropathy in feet)   Cervical / Trunk Assessment Cervical / Trunk Assessment: Kyphotic   Communication Communication Communication: No difficulties   Cognition Arousal/Alertness: Awake/alert Behavior During Therapy: WFL for tasks assessed/performed Overall Cognitive Status: Within Functional Limits for tasks assessed                                     General Comments       Exercises     Shoulder Instructions      Home Living Family/patient expects to be discharged to:: Private residence Living Arrangements: Spouse/significant other Available Help at Discharge: Family;Available 24 hours/day Type of Home: Independent living facility ([facility]) Home Access: Level entry     Home Layout: One level     Bathroom Shower/Tub: Teacher, early years/pre: Handicapped height Bathroom Accessibility: Yes   Home Equipment: Environmental consultant - 2 wheels;Cane - single point;Grab bars - tub/shower;Grab bars - toilet;Shower seat - built in   Additional Comments: lives with wife. Son lives in [place].      Prior Functioning/Environment Level of Independence: Independent        Comments: (I) without AD prior admission began to use RW on sunday with onset of LE weakness; reports x1 fall in July due to LE weakness        OT Problem List: Decreased knowledge of use of DME or AE;Decreased activity tolerance;Decreased strength      OT Treatment/Interventions:      OT Goals(Current goals can be found in the care plan section) Acute Rehab OT Goals Patient Stated Goal: Go back home and continue therapy to get stronger again OT Goal Formulation: All assessment and education complete, DC therapy  OT Frequency:     Barriers to D/C:            Co-evaluation               AM-PAC OT "6 Clicks" Daily Activity     Outcome Measure Help from another person eating meals?: None Help from another person taking care of personal grooming?: None Help from another person toileting, which includes using toliet, bedpan, or urinal?: None Help from another person bathing (including washing, rinsing, drying)?: None Help from another person to put on and taking off regular upper body clothing?: None Help from another person to put on and taking off regular lower body clothing?: None 6 Click Score: 24   End of Session Equipment Utilized During Treatment: Rolling walker Nurse Communication: Mobility status  Activity Tolerance: Patient tolerated treatment well Patient left: in chair;with call bell/phone within reach  OT Visit Diagnosis: Muscle weakness (generalized) (M62.81)                Time: [id number] OT Time Calculation (min): 28 min Charges:  OT General Charges $OT Visit: 1 Visit OT Evaluation $OT Eval Low Complexity: 1 Low OT Treatments $Therapeutic Activity: 8-22 mins  Pedro Whiters, OTR/L [place]  Office [phone] Pager: [phone]  Janelie Goltz L Cleon Thoma 05/25/2021, 3:18 PM

## 2021-05-25 NOTE — Progress Notes (Signed)
Physical Therapy Treatment Patient Details Name: Craig Jensen MRN: [id number] DOB: April 08, 1930 Today's Date: 05/25/2021         PT Comments    Pt is progressing toward acute PT goals this session with good tolerance to introduction of LE there ex and ambulation with less restrictive single UE support. Pt ambulated bouts of 5f, 452f 2088fith standing rest breaks between due to SOB and reported LE muscle fatigue. Pt required up to MIN A with ambulation with use of single UE support on IV pole to maintain stability, observed increase in balance deficits with further ambulation distance and increasing fatigue. Pt remains below baseline mobility level and will benefit from continued skilled PT to increase his independence and maximize safety with mobility.     Recommendations for follow up therapy are one component of a multi-disciplinary discharge planning process, led by the attending physician.  Recommendations may be updated based on patient status, additional functional criteria and insurance authorization.    05/25/21 1000  PT Visit Information  Last PT Received On 05/25/21  Assistance Needed +1  History of Present Illness Pt is an [age]o. male presented to the ED with significant weakness fever and fatigue. Admitted for sepsis secondary to UTI. PMH significant for diabetes, hypertension, hyperlipidemia, hypothyroidism, peripheral neuropathy, prostate cancer  Subjective Data  Subjective "Feeling pretty good, able to walk little better still using the walker"  Patient Stated Goal Go back home and continue therapy to get stronger again  Precautions  Precautions Fall  Restrictions  Weight Bearing Restrictions No  Pain Assessment  Pain Assessment No/denies pain  Cognition  Arousal/Alertness Awake/alert  Behavior During Therapy [facility] tasks assessed/performed  Overall Cognitive Status Within Functional Limits for tasks assessed  Bed Mobility  General bed mobility comments Pt  OOB in recliner upon entry  Transfers  Overall transfer level Needs assistance  Equipment used None  Transfers Sit to/from Stand  Sit to Stand Min guard  General transfer comment MIN guard provided for safety, pt with use of B UEs and momentum for power up from recliner chair, improved from yesterday's session.  Ambulation/Gait  Ambulation/Gait assistance Min assist  Gait Distance (Feet) 40 Feet (additional [id number] device IV Pole  Gait Pattern/deviations Step-through pattern;Decreased stride length;Drifts right/left  General Gait Details pt ambulated 58ft,69ft, 54f wi13ftanding rest breaks between due to SOB and reported LE muscle fatigue. Pt ambulated with use of IV pole requiring MIN A from therapist for stability due to unsteadiness. Pt noted to have onset of drifting L/R and mildly flexed trunk with increasing fatigue requiring cues for upright posture and facilitation at hips for correction with path deviation.  Gait velocity decr  Balance  Overall balance assessment Needs assistance  Sitting-balance support Feet supported  Sitting balance-Leahy Scale Good  Standing balance support Single extremity supported;During functional activity  Standing balance-Leahy Scale Fair  Exercises  Exercises General Lower Extremity  General Exercises - Lower Extremity  Toe Raises AROM;Both;10 reps;Standing (pt noted with forward weight shift of hips/trunk to assist with raising heels off ground, cues provided for proper technique)  Hip ABduction/ADduction AROM;Both;10 reps;Standing  Straight Leg Raises AROM;Both;10 reps;Seated  Long Arc Quad AROM;Both;10 reps;Seated  PT - End of Session  Equipment Utilized During Treatment Gait belt  Activity Tolerance Patient tolerated treatment well  Patient left in chair;with call bell/phone within reach;with chair alarm set  Nurse Communication Mobility status   PT - Assessment/Plan  PT Plan Current plan remains appropriate  PT Visit  Diagnosis Unsteadiness on feet (R26.81);Muscle weakness (generalized) (M62.81)  PT Frequency (ACUTE ONLY) Min 3X/week  Follow Up Recommendations Home health PT;Supervision for mobility/OOB  PT equipment None recommended by PT (pt owns RW)  AM-PAC PT "6 Clicks" Mobility Outcome Measure (Version 2)  Help needed turning from your back to your side while in a flat bed without using bedrails? 4  Help needed moving from lying on your back to sitting on the side of a flat bed without using bedrails? 3  Help needed moving to and from a bed to a chair (including a wheelchair)? 3  Help needed standing up from a chair using your arms (e.g., wheelchair or bedside chair)? 3  Help needed to walk in hospital room? 3  Help needed climbing 3-5 steps with a railing?  2  6 Click Score 18  Consider Recommendation of Discharge To: Home with [facility]  Progressive Mobility  What is the highest level of mobility based on the progressive mobility assessment? Level 5 (Walks with assist in room/hall) - Balance while stepping forward/back and can walk in room with assist - Complete  Mobility Ambulated with assistance in hallway  PT Goal Progression  Progress towards PT goals Progressing toward goals  Acute Rehab PT Goals  PT Goal Formulation With patient  Time For Goal Achievement 06/07/21  Potential to Achieve Goals Good  PT Time Calculation  PT Start Time (ACUTE ONLY) 1035  PT Stop Time (ACUTE ONLY) 1103  PT Time Calculation (min) (ACUTE ONLY) 28 min  PT General Charges  $$ ACUTE PT VISIT 1 Visit  PT Treatments  $Gait Training 8-22 mins  $Therapeutic Exercise 8-22 mins                     Festus Barren PT, DPT  Acute Rehabilitation Services  Office [phone]  05/25/2021, 2:32 PM

## 2021-05-26 DIAGNOSIS — E876 Hypokalemia: Secondary | ICD-10-CM | POA: Diagnosis not present

## 2021-05-26 DIAGNOSIS — A419 Sepsis, unspecified organism: Secondary | ICD-10-CM | POA: Diagnosis not present

## 2021-05-26 DIAGNOSIS — F411 Generalized anxiety disorder: Secondary | ICD-10-CM | POA: Diagnosis not present

## 2021-05-26 DIAGNOSIS — I10 Essential (primary) hypertension: Secondary | ICD-10-CM | POA: Diagnosis not present

## 2021-05-26 LAB — CBC
HCT: 32 % — ABNORMAL LOW (ref 39.0–52.0)
Hemoglobin: 10.8 g/dL — ABNORMAL LOW (ref 13.0–17.0)
MCH: 30.9 pg (ref 26.0–34.0)
MCHC: 33.8 g/dL (ref 30.0–36.0)
MCV: 91.4 fL (ref 80.0–100.0)
Platelets: 221 10*3/uL (ref 150–400)
RBC: 3.5 MIL/uL — ABNORMAL LOW (ref 4.22–5.81)
RDW: 13.2 % (ref 11.5–15.5)
WBC: 9.6 10*3/uL (ref 4.0–10.5)
nRBC: 0 % (ref 0.0–0.2)

## 2021-05-26 LAB — BASIC METABOLIC PANEL
Anion gap: 9 (ref 5–15)
BUN: 18 mg/dL (ref 8–23)
CO2: 25 mmol/L (ref 22–32)
Calcium: 8.5 mg/dL — ABNORMAL LOW (ref 8.9–10.3)
Chloride: 105 mmol/L (ref 98–111)
Creatinine, Ser: 0.76 mg/dL (ref 0.61–1.24)
GFR, Estimated: >60 mL/min (ref >60)
Glucose, Bld: 162 mg/dL — ABNORMAL HIGH (ref 70–99)
Potassium: 3.9 mmol/L (ref 3.5–5.1)
Sodium: 139 mmol/L (ref 135–145)

## 2021-05-26 LAB — GLUCOSE, CAPILLARY
Glucose-Capillary: 148 mg/dL — ABNORMAL HIGH (ref 70–99)
Glucose-Capillary: 180 mg/dL — ABNORMAL HIGH (ref 70–99)
Glucose-Capillary: 137 mg/dL — ABNORMAL HIGH (ref 70–99)

## 2021-05-26 MED ORDER — LOPERAMIDE HCL 2 MG PO CAPS: 2.0000 mg | ORAL_CAPSULE | ORAL | Status: DC | PRN

## 2021-05-26 MED ORDER — CEFDINIR 300 MG PO CAPS
300.0000 mg | ORAL_CAPSULE | Freq: Two times a day (BID) | ORAL | Status: DC
  Administered 2021-05-26 – 2021-05-27 (×3): 300 mg via ORAL
  Filled 2021-05-26 (×4): qty 1

## 2021-05-26 MED ORDER — SODIUM CHLORIDE 0.9 % IV SOLN
25.0000 mg | INTRAVENOUS | Status: DC | PRN
  Administered 2021-05-26: 25 mg via INTRAVENOUS
  Filled 2021-05-26: qty 0.5
  Filled 2021-05-26: qty 25

## 2021-05-26 MED ORDER — LOPERAMIDE HCL 2 MG PO CAPS
4.0000 mg | ORAL_CAPSULE | Freq: Once | ORAL | Status: AC
  Administered 2021-05-26: 4 mg via ORAL
  Filled 2021-05-26: qty 2

## 2021-05-26 NOTE — Progress Notes (Signed)
PROGRESS NOTE    Craig Jensen  [id number] DOB: Apr 28, 1930 DOA: 05/22/2021 PCP: Burnard Bunting, MD    Chief Complaint  Patient presents with   Extremity Weakness    Brief Narrative:  Patient is a pleasant 87 year old gentleman history of diabetes, hypertension, hyperlipidemia, hypothyroidism, peripheral neuropathy, prostate cancer presented to the ED with significant weakness fever and fatigue.  Work-up concerning for UTI as well as bacteremia.  Patient placed empirically on IV Rocephin   Assessment & Plan:   Principal Problem:   Sepsis secondary to UTI [facility]) Active Problems:   Malignant neoplasm of prostate ([place])   Hypokalemia   Pressure injury of skin   Cough   Acute cystitis with hematuria   Essential hypertension   Generalized anxiety disorder   Other hyperlipidemia   Primary hypothyroidism   Polyneuropathy   Type 2 diabetes mellitus without complications ([place])   Bacteremia   1 sepsis secondary to Klebsiella pneumonia bacteremia and Klebsiella pneumonia UTI -Patient presented with significant weakness, fever with a temp as high as 102.1 in the ED, increased respiratory rate of 30, leukocytosis with a white count of 20.6, lactic acid level of 2.1, urinalysis concerning for UTI.   -Fever curve trending down.  Leukocytosis trending down.   -Patient pancultured with blood cultures and urine cultures positive for Klebsiella pneumonia resistant to ampicillin but sensitive to the cephalosporins, ciprofloxacin, Bactrim, gentamicin, Zosyn.   -Transition from IV Rocephin to Omnicef to complete a 10-day course of treatment.   2.  Hypokalemia/hypomagnesemia -Potassium at 3.9.  Magnesium at 2.0.   -Repeat labs in the morning.  3.  Noninsulin-dependent diabetes mellitus -Hemoglobin A1c 7.3(03/29/2021). -CBG 162 this morning.  -Continue to hold oral hypoglycemic agents  -SSI.    4.  Hypothyroidism -Synthroid.    5.  Hypertension -Continue current regimen of  Norvasc and metoprolol.   -Outpatient follow-up.   6.  History of prostate cancer -Not on treatment at this time. -Outpatient follow-up with urology and oncology.  7.  Hyperlipidemia -Not on a statin. - outpatient follow-up with PCP.  8.  Generalized anxiety disorder -Hydroxyzine as needed.  9.  Watery stools -Patient with complaints of watery stools yesterday and placed on Florastor. -Patient still with ongoing watery stools and as such we will give a dose of Imodium 4 mg p.o. x1 and place on Imodium as needed.   DVT prophylaxis: Lovenox Code Status: Full Family Communication: Updated patient.  No family at bedside. Disposition:   Status is: Inpatient  Remains inpatient appropriate because:Inpatient level of care appropriate due to severity of illness  Dispo: The patient is from: Home              Anticipated d/c is to: Home              Patient currently is not medically stable to d/c.   Difficult to place patient No       Consultants:  None  Procedures:  Chest x-ray 05/22/2021  Antimicrobials:  IV Rocephin 05/22/2021>>>>> 05/25/2021 -IV Ancef 05/25/2021>>>>> 05/26/2021 Omnicef 05/26/2021>>>>>   Subjective: Sitting up in chair.  Asking whether he is going to be discharged today.  Overall feeling better.  Stated ambulating okay.  Able to have good urine output.  Denies any chest pain.  No shortness of breath.  No abdominal pain.  Patient states he has been having the urge to have a bowel movement and when he sits he has a sensation that he might be clear watery sensation however unable  to tell whether he is having no stool versus loose stools.    Objective: Vitals:   05/25/21 0515 05/25/21 1124 05/25/21 2102 05/26/21 0526  BP: (!) 152/73 132/65 (!) 159/75 (!) 144/71  Pulse: 64 62 73 65  Resp: '18 20 19 19  '$ Temp: 99.3 F (37.4 C) 97.8 F (36.6 C) 98.6 F (37 C) 98.8 F (37.1 C)  TempSrc:   Oral Oral  SpO2: 94% 95% 95% 94%  Weight:      Height:         Intake/Output Summary (Last 24 hours) at 05/26/2021 1320 Last data filed at 05/26/2021 0900 Gross per 24 hour  Intake --  Output 1100 ml  Net -1100 ml    Filed Weights   05/22/21 0811  Weight: 102.1 kg    Examination:  General exam: : NAD Respiratory system: CTA B anterior lung fields.  No wheezes, no rhonchi.  Speaking in full sentences.  Normal respiratory effort. Cardiovascular system: Regular rate and rhythm no murmurs rubs or gallops.  No JVD.  No lower extremity edema.  Gastrointestinal system: Abdomen soft, nontender, nondistended, positive bowel sounds.  No rebound.  No guarding. Central nervous system: Alert and oriented. No focal neurological deficits. Extremities: Symmetric 5 x 5 power. Skin: No rashes, lesions or ulcers Psychiatry: Judgement and insight appear normal. Mood & affect appropriate.  Data Reviewed: I have personally reviewed following labs and imaging studies  CBC: Recent Labs  Lab 05/22/21 0910 05/22/21 1934 05/23/21 0528 05/24/21 0439 05/25/21 0428 05/26/21 0510  WBC 22.9* 20.6* 18.6* 13.1* 9.9 9.6  NEUTROABS 18.5*  --   --  10.5* 7.4  --   HGB 12.8* 11.3* 11.7* 10.6* 10.2* 10.8*  HCT 38.5* 33.3* 35.2* 32.0* 30.1* 32.0*  MCV 93.2 91.7 93.6 92.8 91.8 91.4  PLT 290 220 194 194 204 221     Basic Metabolic Panel: Recent Labs  Lab 05/22/21 0910 05/22/21 1934 05/23/21 0528 05/24/21 0439 05/25/21 0428 05/26/21 0510  NA 135  --  139 138 137 139  K 4.0  --  4.0 3.7 3.7 3.9  CL 102  --  106 106 105 105  CO2 23  --  '25 24 24 25  '$ GLUCOSE 204*  --  184* 238* 175* 162*  BUN 23  --  '16 15 15 18  '$ CREATININE 1.30* 0.95 0.90 0.79 0.92 0.76  CALCIUM 8.7*  --  8.8* 8.6* 8.2* 8.5*  MG  --   --   --  1.6* 2.0  --      GFR: Estimated Creatinine Clearance: 77.4 mL/min (by C-G formula based on SCr of 0.76 mg/dL).  Liver Function Tests: Recent Labs  Lab 05/22/21 0910 05/23/21 0528  AST 18 24  ALT 16 22  ALKPHOS 67 68  BILITOT 0.9 1.1   PROT 7.0 6.0*  ALBUMIN 3.3* 2.7*     CBG: Recent Labs  Lab 05/24/21 1604 05/24/21 2043 05/25/21 0740 05/25/21 1122 05/25/21 1637  GLUCAP 182* 136* 170* 192* 175*      Recent Results (from the past 240 hour(s))  Culture, blood (single)     Status: Abnormal   Collection Time: 05/22/21  9:10 AM   Specimen: Left Antecubital; Blood  Result Value Ref Range Status   Specimen Description   Final    LEFT ANTECUBITAL Performed at [facility], [address] [place], [place] [address]    Special Requests   Final    BOTTLES DRAWN AEROBIC AND ANAEROBIC Blood  Culture adequate volume Performed at [facility], [address] [place], [place] [address]    Culture  Setup Time   Final    GRAM NEGATIVE RODS ANAEROBIC BOTTLE ONLY CRITICAL RESULT CALLED TO, READ BACK BY AND VERIFIED WITH: PHARMD M.LILLISTON AT 0943 ON 05/23/2021 BY T.SAAD. Performed at [facility], [place] [address] [place], [place] [address]    Culture KLEBSIELLA PNEUMONIAE (A)  Final   Report Status 05/25/2021 FINAL  Final   Organism ID, Bacteria KLEBSIELLA PNEUMONIAE  Final      Susceptibility   Klebsiella pneumoniae - MIC*    AMPICILLIN >=32 RESISTANT Resistant     CEFAZOLIN <=4 SENSITIVE Sensitive     CEFEPIME <=0.12 SENSITIVE Sensitive     CEFTAZIDIME <=1 SENSITIVE Sensitive     CEFTRIAXONE <=0.25 SENSITIVE Sensitive     CIPROFLOXACIN <=0.25 SENSITIVE Sensitive     GENTAMICIN <=1 SENSITIVE Sensitive     IMIPENEM <=0.25 SENSITIVE Sensitive     TRIMETH/SULFA <=20 SENSITIVE Sensitive     AMPICILLIN/SULBACTAM 4 SENSITIVE Sensitive     PIP/TAZO <=4 SENSITIVE Sensitive     * KLEBSIELLA PNEUMONIAE  Blood Culture ID Panel (Reflexed)     Status: Abnormal   Collection Time: 05/22/21  9:10 AM  Result Value Ref Range Status   Enterococcus faecalis NOT DETECTED NOT DETECTED Final   Enterococcus Faecium NOT DETECTED NOT DETECTED Final   Listeria monocytogenes NOT DETECTED NOT DETECTED  Final   Staphylococcus species NOT DETECTED NOT DETECTED Final   Staphylococcus aureus (BCID) NOT DETECTED NOT DETECTED Final   Staphylococcus epidermidis NOT DETECTED NOT DETECTED Final   Staphylococcus lugdunensis NOT DETECTED NOT DETECTED Final   Streptococcus species NOT DETECTED NOT DETECTED Final   Streptococcus agalactiae NOT DETECTED NOT DETECTED Final   Streptococcus pneumoniae NOT DETECTED NOT DETECTED Final   Streptococcus pyogenes NOT DETECTED NOT DETECTED Final   A.calcoaceticus-baumannii NOT DETECTED NOT DETECTED Final   Bacteroides fragilis NOT DETECTED NOT DETECTED Final   Enterobacterales DETECTED (A) NOT DETECTED Final    Comment: Enterobacterales represent a large order of gram negative bacteria, not a single organism. CRITICAL RESULT CALLED TO, READ BACK BY AND VERIFIED WITH: PHARMD M.LILLISTON AT [id number] ON 05/23/2021 BY T.SAAD.    Enterobacter cloacae complex NOT DETECTED NOT DETECTED Final   Escherichia coli NOT DETECTED NOT DETECTED Final   Klebsiella aerogenes NOT DETECTED NOT DETECTED Final   Klebsiella oxytoca NOT DETECTED NOT DETECTED Final   Klebsiella pneumoniae DETECTED (A) NOT DETECTED Final    Comment: CRITICAL RESULT CALLED TO, READ BACK BY AND VERIFIED WITH: PHARMD M.LILLISTON AT [id number] ON 05/23/2021 BY T.SAAD.    Proteus species NOT DETECTED NOT DETECTED Final   Salmonella species NOT DETECTED NOT DETECTED Final   Serratia marcescens NOT DETECTED NOT DETECTED Final   Haemophilus influenzae NOT DETECTED NOT DETECTED Final   Neisseria meningitidis NOT DETECTED NOT DETECTED Final   Pseudomonas aeruginosa NOT DETECTED NOT DETECTED Final   Stenotrophomonas maltophilia NOT DETECTED NOT DETECTED Final   Candida albicans NOT DETECTED NOT DETECTED Final   Candida auris NOT DETECTED NOT DETECTED Final   Candida glabrata NOT DETECTED NOT DETECTED Final   Candida krusei NOT DETECTED NOT DETECTED Final   Candida parapsilosis NOT DETECTED NOT DETECTED Final    Candida tropicalis NOT DETECTED NOT DETECTED Final   Cryptococcus neoformans/gattii NOT DETECTED NOT DETECTED Final   CTX-M ESBL NOT DETECTED NOT DETECTED Final   Carbapenem resistance IMP NOT DETECTED NOT  DETECTED Final   Carbapenem resistance KPC NOT DETECTED NOT DETECTED Final   Carbapenem resistance NDM NOT DETECTED NOT DETECTED Final   Carbapenem resist OXA 48 LIKE NOT DETECTED NOT DETECTED Final   Carbapenem resistance VIM NOT DETECTED NOT DETECTED Final    Comment: Performed at [facility], [place] [address] [place], [place] [address]  Resp Panel by RT-PCR (Flu A&B, Covid) Nasopharyngeal Swab     Status: None   Collection Time: 05/22/21 10:05 AM   Specimen: Nasopharyngeal Swab; Nasopharyngeal(NP) swabs in vial transport medium  Result Value Ref Range Status   SARS Coronavirus 2 by RT PCR NEGATIVE NEGATIVE Final    Comment: (NOTE) SARS-CoV-2 target nucleic acids are NOT DETECTED.  The SARS-CoV-2 RNA is generally detectable in upper respiratory specimens during the acute phase of infection. The lowest concentration of SARS-CoV-2 viral copies this assay can detect is 138 copies/mL. A negative result does not preclude SARS-Cov-2 infection and should not be used as the sole basis for treatment or other patient management decisions. A negative result may occur with  improper specimen collection/handling, submission of specimen other than nasopharyngeal swab, presence of viral mutation(s) within the areas targeted by this assay, and inadequate number of viral copies(<138 copies/mL). A negative result must be combined with clinical observations, patient history, and epidemiological information. The expected result is Negative.  Fact Sheet for Patients:  EntrepreneurPulse.com.au  Fact Sheet for Healthcare Providers:  IncredibleEmployment.be  This test is no t yet approved or cleared by the [place] FDA and  has been authorized for  detection and/or diagnosis of SARS-CoV-2 by FDA under an Emergency Use Authorization (EUA). This EUA will remain  in effect (meaning this test can be used) for the duration of the COVID-19 declaration under Section 564(b)(1) of the Act, 21 U.S.C.section 360bbb-3(b)(1), unless the authorization is terminated  or revoked sooner.       Influenza A by PCR NEGATIVE NEGATIVE Final   Influenza B by PCR NEGATIVE NEGATIVE Final    Comment: (NOTE) The Xpert Xpress SARS-CoV-2/FLU/RSV plus assay is intended as an aid in the diagnosis of influenza from Nasopharyngeal swab specimens and should not be used as a sole basis for treatment. Nasal washings and aspirates are unacceptable for Xpert Xpress SARS-CoV-2/FLU/RSV testing.  Fact Sheet for Patients: EntrepreneurPulse.com.au  Fact Sheet for Healthcare Providers: IncredibleEmployment.be  This test is not yet approved or cleared by the [place] FDA and has been authorized for detection and/or diagnosis of SARS-CoV-2 by FDA under an Emergency Use Authorization (EUA). This EUA will remain in effect (meaning this test can be used) for the duration of the COVID-19 declaration under Section 564(b)(1) of the Act, 21 U.S.C. section 360bbb-3(b)(1), unless the authorization is terminated or revoked.  Performed at [facility], [address] [place], [place] [address]   Urine Culture     Status: Abnormal   Collection Time: 05/22/21 11:05 AM   Specimen: Urine, Catheterized  Result Value Ref Range Status   Specimen Description   Final    URINE, CATHETERIZED Performed at [place] [address], [place], [place] [address]    Special Requests   Final    NONE Performed at [facility], [place] [address], [place], [place] [address]    Culture >=100,000 COLONIES/mL KLEBSIELLA PNEUMONIAE (A)  Final   Report Status 05/25/2021 FINAL  Final   Organism ID,  Bacteria KLEBSIELLA PNEUMONIAE (A)  Final      Susceptibility  Klebsiella pneumoniae - MIC*    AMPICILLIN >=32 RESISTANT Resistant     CEFAZOLIN <=4 SENSITIVE Sensitive     CEFEPIME <=0.12 SENSITIVE Sensitive     CEFTRIAXONE <=0.25 SENSITIVE Sensitive     CIPROFLOXACIN <=0.25 SENSITIVE Sensitive     GENTAMICIN <=1 SENSITIVE Sensitive     IMIPENEM <=0.25 SENSITIVE Sensitive     NITROFURANTOIN <=16 SENSITIVE Sensitive     TRIMETH/SULFA <=20 SENSITIVE Sensitive     AMPICILLIN/SULBACTAM 4 SENSITIVE Sensitive     PIP/TAZO <=4 SENSITIVE Sensitive     * >=100,000 COLONIES/mL KLEBSIELLA PNEUMONIAE  Culture, blood (routine x 2)     Status: None (Preliminary result)   Collection Time: 05/25/21 12:14 PM   Specimen: BLOOD  Result Value Ref Range Status   Specimen Description   Final    BLOOD RIGHT ANTECUBITAL Performed at [facility] [address], [place], [place] [address]    Special Requests   Final    BOTTLES DRAWN AEROBIC ONLY Blood Culture adequate volume Performed at [facility] [address], [place], [place] [address]    Culture   Final    NO GROWTH < 24 HOURS Performed at [facility] [address], [place], [place] [address]    Report Status PENDING  Incomplete  Culture, blood (routine x 2)     Status: None (Preliminary result)   Collection Time: 05/25/21 12:14 PM   Specimen: BLOOD  Result Value Ref Range Status   Specimen Description   Final    BLOOD LEFT ANTECUBITAL Performed at [facility] [address] [place], [place] [address]    Special Requests   Final    BOTTLES DRAWN AEROBIC ONLY Blood Culture adequate volume Performed at [facility] [address] [place], [place] [address]    Culture   Final    NO GROWTH < 24 HOURS Performed at [facility] [address] [place], [place] [address]    Report Status PENDING  Incomplete          Radiology  Studies: No results found.      Scheduled Meds:  amLODipine  5 mg Oral Daily   darifenacin  7.5 mg Oral Daily   enoxaparin (LOVENOX) injection  40 mg Subcutaneous Q24H   influenza vaccine adjuvanted  0.5 mL Intramuscular Tomorrow-1000   insulin aspart  0-15 Units Subcutaneous TID WC   insulin aspart  0-5 Units Subcutaneous QHS   levothyroxine  50 mcg Oral QAC breakfast   mouth rinse  15 mL Mouth Rinse BID   metoprolol succinate  25 mg Oral Daily   saccharomyces boulardii  250 mg Oral BID   Continuous Infusions:   ceFAZolin (ANCEF) IV 2 g (05/26/21 1313)     LOS: 4 days    Time spent: 35 minutes    Irine Seal, MD [facility]   To contact the attending provider between 7A-7P or the covering provider during after hours 7P-7A, please log into the web site [email or web address] and access using universal [place] password for that web site. If you do not have the password, please call the hospital operator.  05/26/2021, 1:20 PM

## 2021-05-27 DIAGNOSIS — F411 Generalized anxiety disorder: Secondary | ICD-10-CM | POA: Diagnosis not present

## 2021-05-27 DIAGNOSIS — R7881 Bacteremia: Secondary | ICD-10-CM | POA: Diagnosis not present

## 2021-05-27 DIAGNOSIS — B961 Klebsiella pneumoniae [K. pneumoniae] as the cause of diseases classified elsewhere: Secondary | ICD-10-CM

## 2021-05-27 DIAGNOSIS — I10 Essential (primary) hypertension: Secondary | ICD-10-CM | POA: Diagnosis not present

## 2021-05-27 DIAGNOSIS — B9689 Other specified bacterial agents as the cause of diseases classified elsewhere: Secondary | ICD-10-CM

## 2021-05-27 DIAGNOSIS — A419 Sepsis, unspecified organism: Secondary | ICD-10-CM | POA: Diagnosis not present

## 2021-05-27 DIAGNOSIS — N390 Urinary tract infection, site not specified: Secondary | ICD-10-CM | POA: Diagnosis present

## 2021-05-27 LAB — BASIC METABOLIC PANEL
Anion gap: 8 (ref 5–15)
BUN: 16 mg/dL (ref 8–23)
CO2: 27 mmol/L (ref 22–32)
Calcium: 8.2 mg/dL — ABNORMAL LOW (ref 8.9–10.3)
Chloride: 104 mmol/L (ref 98–111)
Creatinine, Ser: 0.77 mg/dL (ref 0.61–1.24)
GFR, Estimated: >60 mL/min (ref >60)
Glucose, Bld: 150 mg/dL — ABNORMAL HIGH (ref 70–99)
Potassium: 3.7 mmol/L (ref 3.5–5.1)
Sodium: 139 mmol/L (ref 135–145)

## 2021-05-27 LAB — CBC
HCT: 32.2 % — ABNORMAL LOW (ref 39.0–52.0)
Hemoglobin: 10.7 g/dL — ABNORMAL LOW (ref 13.0–17.0)
MCH: 30.7 pg (ref 26.0–34.0)
MCHC: 33.2 g/dL (ref 30.0–36.0)
MCV: 92.5 fL (ref 80.0–100.0)
Platelets: 252 10*3/uL (ref 150–400)
RBC: 3.48 MIL/uL — ABNORMAL LOW (ref 4.22–5.81)
RDW: 13.2 % (ref 11.5–15.5)
WBC: 8.8 10*3/uL (ref 4.0–10.5)
nRBC: 0 % (ref 0.0–0.2)

## 2021-05-27 LAB — MAGNESIUM: Magnesium: 1.8 mg/dL (ref 1.7–2.4)

## 2021-05-27 MED ORDER — ONDANSETRON HCL 4 MG PO TABS: 4.0000 mg | ORAL_TABLET | Freq: Four times a day (QID) | ORAL | 0 refills | Status: AC | PRN

## 2021-05-27 MED ORDER — HYDROXYZINE HCL 25 MG PO TABS: 25.0000 mg | ORAL_TABLET | Freq: Three times a day (TID) | ORAL | 0 refills | Status: AC | PRN

## 2021-05-27 MED ORDER — ACETAMINOPHEN 325 MG PO TABS: 650.0000 mg | ORAL_TABLET | Freq: Four times a day (QID) | ORAL | Status: AC | PRN

## 2021-05-27 MED ORDER — CEFDINIR 300 MG PO CAPS: 300.0000 mg | ORAL_CAPSULE | Freq: Two times a day (BID) | ORAL | 0 refills | Status: AC

## 2021-05-27 MED ORDER — LOPERAMIDE HCL 2 MG PO CAPS: 2.0000 mg | ORAL_CAPSULE | ORAL | 0 refills | Status: AC | PRN

## 2021-05-27 MED ORDER — SACCHAROMYCES BOULARDII 250 MG PO CAPS: 250.0000 mg | ORAL_CAPSULE | Freq: Two times a day (BID) | ORAL | 0 refills | Status: AC

## 2021-05-27 NOTE — Progress Notes (Signed)
Mobility Specialist - Progress Note     05/27/21 1100  Mobility  Activity Ambulated in hall  Level of Assistance Contact guard assist, steadying assist  Bliss wheel walker  Distance Ambulated (ft) 250 ft  Mobility Ambulated with assistance in hallway  Mobility Response Tolerated well  Mobility performed by Mobility specialist  $Mobility charge 1 Mobility   Upon entry pt was agreeable to ambulate and required no assistance to stand from recliner. Pt used RW to walk ~250 ft in hallway and required 2 standing rest breaks to practice pursed breathing. While walking, contact guard was necessary to keep pt stable. At end of session, pt used bathroom, returned to recliner and was left with call bell at side and chair alarm on.   Blue Specialist Acute Rehabilitation Services Phone: [phone] 05/27/21, 12:03 PM

## 2021-05-27 NOTE — TOC Transition Note (Signed)
Transition of Care [facility]) - CM/SW Discharge Note   Patient Details  Name: Craig Jensen MRN: [id number] Date of Birth: [birth year]  [facility] CM/SW Contact:  Dessa Phi, RN Phone Number: 05/27/2021, 3:12 PM   Clinical Narrative: Damaris Schooner to patient/[facility] liason Megan about d/c plans-return back to [facility]. [facility] for HHPT-faxed with confirmation to [facility]-patient will f/u tomorrow about hs biling concerns with HHPT @ facility. Paitent haNo further CM needs.      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

## 2021-05-27 NOTE — Discharge Summary (Signed)
Physician Discharge Summary  Craig Jensen [id number] DOB: March 23, 1930 DOA: 05/22/2021  PCP: Burnard Bunting, MD  Admit date: 05/22/2021 Discharge date: 05/27/2021  Time spent: 55 minutes  Recommendations for Outpatient Follow-up:  Follow-up with Burnard Bunting, MD in 1 to 2 weeks.  On follow-up patient need a basic metabolic profile as well as a magnesium level checked to follow-up on electrolytes and renal function.   Discharge Diagnoses:  Principal Problem:   Sepsis secondary to UTI [facility]) Active Problems:   Bacteremia due to Klebsiella pneumoniae   UTI due to Klebsiella species   Malignant neoplasm of prostate ([place])   Hypokalemia   Pressure injury of skin   Cough   Acute cystitis with hematuria   Essential hypertension   Generalized anxiety disorder   Other hyperlipidemia   Primary hypothyroidism   Polyneuropathy   Type 2 diabetes mellitus without complications ([place])   Bacteremia   Discharge Condition: Stable and improved.  Diet recommendation: Carb modified diet  Filed Weights   05/22/21 0811  Weight: 102.1 kg    History of present illness:  HPI per Dr. Armstead [name] is a 87 y.o. male with medical history significant of diabetes, essential hypertension, hyperlipidemia, hypothyroidism, peripheral neuropathy, prostate cancer who presents to the ER with significant weakness fever and fatigue.  Symptoms have been going on for at least 2 days.  He is felt weak in his lower extremities to the point where he could not walk around.  He has a T-max of 103.  Patient also has decreased appetite nausea nonproductive cough.  He has history of prostate cancer and has a urinary symptoms a lot.  He has had multiple episodes UTI before.  He was last treated for UTI about 3 months ago.  Patient was found to meet sepsis criteria in the ER and has evidence of UTI.  Being admitted with sepsis due to UTI..   ED Course: Temperature is 102.1, blood pressure 150/87, pulse 42  respiratory of 30 and oxygen sat 91% on room air.  White count 22.9 hemoglobin 11.3 and platelets 220.  Lactic acid 2.0, urinalysis showed cloudy, nitrite positive many bacteria WBC more than 50..  Chest x-ray is negative   Hospital Course:  1 sepsis secondary to Klebsiella pneumonia bacteremia and Klebsiella pneumonia UTI -Patient presented with significant weakness, fever with a temp as high as 102.1 in the ED, increased respiratory rate of 30, leukocytosis with a white count of 20.6, lactic acid level of 2.1, urinalysis concerning for UTI.   -Fever curve trended down.   -Leukocytosis trended down.   -Patient pancultured with blood cultures and urine cultures positive for Klebsiella pneumonia resistant to ampicillin but sensitive to the cephalosporins, ciprofloxacin, Bactrim, gentamicin, Zosyn.   -Patient was initially placed on IV Rocephin improved clinically subsequently transition to [place] to complete a 10-day course of treatment.   -Patient was discharged home on 5 more days of Omnicef to complete an antibiotic course of 10 days.   -Outpatient follow-up with PCP.   2.  Hypokalemia/hypomagnesemia -Repleted during the hospitalization.   -Outpatient follow-up with PCP.  3.  Noninsulin-dependent diabetes mellitus -Hemoglobin A1c 7.3(03/29/2021). -Patient's oral hypoglycemic agents were held during the hospitalization and patient maintained on sliding scale insulin.   -Oral hypoglycemic agents will be resumed on discharge.  4.  Hypothyroidism -Patient maintained on home regimen Synthroid.    5.  Hypertension -Patient maintained on home regimen of Norvasc and metoprolol for blood pressure control.   -Outpatient follow-up with  PCP.   6.  History of prostate cancer -Not on treatment at this time. -Outpatient follow-up with urology and oncology.  7.  Hyperlipidemia -Not on a statin. - outpatient follow-up with PCP.  8.  Generalized anxiety disorder -Patient placed on hydroxyzine as  needed.  9.  Watery stools -Patient with complaints of watery stools during the hospitalization and placed on Florastor.   -Patient also received a dose of Imodium 4 mg p.o. x1 and placed on Imodium as needed with resolution of watery stools.   -Patient was discharged home on Florastor for 5 more days while taking antibiotics.   -Outpatient follow-up with PCP.  10.  Generalized weakness/debility -Felt likely exacerbated with acute infection and dehydration. -Patient improved clinically on antibiotics and gentle hydration. -Patient seen by PT/OT who recommended home health therapies which were ordered.    Procedures: Chest x-ray 05/22/2021  Consultations: None  Discharge Exam: Vitals:   05/27/21 1007 05/27/21 1345  BP: (!) 183/68 133/60  Pulse: 66 67  Resp:  18  Temp:  98.7 F (37.1 C)  SpO2:  93%    General: NAD Cardiovascular: RRR no murmurs rubs or gallops.  No JVD.  No lower extremity edema. Respiratory: Lungs clear to auscultation bilaterally.  No wheezes, no crackles, no rhonchi.  Discharge Instructions   Discharge Instructions     Diet Carb Modified   Complete by: As directed    Discharge wound care:   Complete by: As directed    As above   Increase activity slowly   Complete by: As directed       Allergies as of 05/27/2021   No Known Allergies      Medication List     TAKE these medications    acetaminophen 325 MG tablet Commonly known as: TYLENOL Take 2 tablets (650 mg total) by mouth every 6 (six) hours as needed for mild pain (or Fever >/= 101).   amLODipine 5 MG tablet Commonly known as: NORVASC Take 5 mg by mouth daily.   cefdinir 300 MG capsule Commonly known as: OMNICEF Take 1 capsule (300 mg total) by mouth every 12 (twelve) hours for 5 days.   hydrOXYzine 25 MG tablet Commonly known as: ATARAX/VISTARIL Take 1 tablet (25 mg total) by mouth 3 (three) times daily as needed for anxiety.   ibuprofen 200 MG tablet Commonly known as:  ADVIL Take 400 mg by mouth every 8 (eight) hours as needed for moderate pain.   levothyroxine 50 MCG tablet Commonly known as: SYNTHROID Take 50 mcg by mouth daily before breakfast.   loperamide 2 MG capsule Commonly known as: IMODIUM Take 1 capsule (2 mg total) by mouth as needed for diarrhea or loose stools.   metFORMIN 500 MG 24 hr tablet Commonly known as: GLUCOPHAGE-XR Take 500 mg by mouth 2 (two) times daily.   metoprolol succinate 25 MG 24 hr tablet Commonly known as: TOPROL-XL Take 25 mg by mouth daily.   ondansetron 4 MG tablet Commonly known as: ZOFRAN Take 1 tablet (4 mg total) by mouth every 6 (six) hours as needed for nausea.   saccharomyces boulardii 250 MG capsule Commonly known as: FLORASTOR Take 1 capsule (250 mg total) by mouth 2 (two) times daily for 5 days.   solifenacin 5 MG tablet Commonly known as: VESICARE Take 5 mg by mouth at bedtime.               Discharge Care Instructions  (From admission, onward)  Start     Ordered   05/27/21 0000  Discharge wound care:       Comments: As above   05/27/21 1602           No Known Allergies  Follow-up Information     Burnard Bunting, MD. Schedule an appointment as soon as possible for a visit in 2 week(s).   Specialty: Internal Medicine Why: f/u in 1-2 weeks Contact information: [address] [place] [address] [phone]                  The results of significant diagnostics from this hospitalization (including imaging, microbiology, ancillary and laboratory) are listed below for reference.    Significant Diagnostic Studies: DG Chest Port 1 View  Result Date: 05/22/2021 CLINICAL DATA:  Cough and fever EXAM: PORTABLE CHEST 1 VIEW COMPARISON:  04/13/2008 FINDINGS: Low volume chest with perihilar indistinctness. Possible cardiomegaly that limited by low volume technique. Aortic tortuosity which is chronic. No edema, effusion, or pneumothorax. Severe  glenohumeral osteoarthritis on the left. Reverse glenohumeral arthroplasty on the right. IMPRESSION: Low volume chest with perihilar that could reflect pneumonia. Electronically Signed   By: Jorje Guild M.D.   On: 05/22/2021 10:53    Microbiology: Recent Results (from the past 240 hour(s))  Culture, blood (single)     Status: Abnormal   Collection Time: 05/22/21  9:10 AM   Specimen: Left Antecubital; Blood  Result Value Ref Range Status   Specimen Description   Final    LEFT ANTECUBITAL Performed at [facility], [address] [place], [place] [address]    Special Requests   Final    BOTTLES DRAWN AEROBIC AND ANAEROBIC Blood Culture adequate volume Performed at [facility], [address] [place], [place] [address]    Culture  Setup Time   Final    GRAM NEGATIVE RODS ANAEROBIC BOTTLE ONLY CRITICAL RESULT CALLED TO, READ BACK BY AND VERIFIED WITH: PHARMD M.LILLISTON AT [id number] ON 05/23/2021 BY T.SAAD. Performed at [facility], [place] [address] [place], [place] [address]    Culture KLEBSIELLA PNEUMONIAE (A)  Final   Report Status 05/25/2021 FINAL  Final   Organism ID, Bacteria KLEBSIELLA PNEUMONIAE  Final      Susceptibility   Klebsiella pneumoniae - MIC*    AMPICILLIN >=32 RESISTANT Resistant     CEFAZOLIN <=4 SENSITIVE Sensitive     CEFEPIME <=0.12 SENSITIVE Sensitive     CEFTAZIDIME <=1 SENSITIVE Sensitive     CEFTRIAXONE <=0.25 SENSITIVE Sensitive     CIPROFLOXACIN <=0.25 SENSITIVE Sensitive     GENTAMICIN <=1 SENSITIVE Sensitive     IMIPENEM <=0.25 SENSITIVE Sensitive     TRIMETH/SULFA <=20 SENSITIVE Sensitive     AMPICILLIN/SULBACTAM 4 SENSITIVE Sensitive     PIP/TAZO <=4 SENSITIVE Sensitive     * KLEBSIELLA PNEUMONIAE  Blood Culture ID Panel (Reflexed)     Status: Abnormal   Collection Time: 05/22/21  9:10 AM  Result Value Ref Range Status   Enterococcus faecalis NOT DETECTED NOT DETECTED Final   Enterococcus Faecium NOT DETECTED NOT  DETECTED Final   Listeria monocytogenes NOT DETECTED NOT DETECTED Final   Staphylococcus species NOT DETECTED NOT DETECTED Final   Staphylococcus aureus (BCID) NOT DETECTED NOT DETECTED Final   Staphylococcus epidermidis NOT DETECTED NOT DETECTED Final   Staphylococcus lugdunensis NOT DETECTED NOT DETECTED Final   Streptococcus species NOT DETECTED NOT DETECTED Final   Streptococcus agalactiae NOT DETECTED NOT DETECTED  Final   Streptococcus pneumoniae NOT DETECTED NOT DETECTED Final   Streptococcus pyogenes NOT DETECTED NOT DETECTED Final   A.calcoaceticus-baumannii NOT DETECTED NOT DETECTED Final   Bacteroides fragilis NOT DETECTED NOT DETECTED Final   Enterobacterales DETECTED (A) NOT DETECTED Final    Comment: Enterobacterales represent a large order of gram negative bacteria, not a single organism. CRITICAL RESULT CALLED TO, READ BACK BY AND VERIFIED WITH: PHARMD M.LILLISTON AT [id number] ON 05/23/2021 BY T.SAAD.    Enterobacter cloacae complex NOT DETECTED NOT DETECTED Final   Escherichia coli NOT DETECTED NOT DETECTED Final   Klebsiella aerogenes NOT DETECTED NOT DETECTED Final   Klebsiella oxytoca NOT DETECTED NOT DETECTED Final   Klebsiella pneumoniae DETECTED (A) NOT DETECTED Final    Comment: CRITICAL RESULT CALLED TO, READ BACK BY AND VERIFIED WITH: PHARMD M.LILLISTON AT [id number] ON 05/23/2021 BY T.SAAD.    Proteus species NOT DETECTED NOT DETECTED Final   Salmonella species NOT DETECTED NOT DETECTED Final   Serratia marcescens NOT DETECTED NOT DETECTED Final   Haemophilus influenzae NOT DETECTED NOT DETECTED Final   Neisseria meningitidis NOT DETECTED NOT DETECTED Final   Pseudomonas aeruginosa NOT DETECTED NOT DETECTED Final   Stenotrophomonas maltophilia NOT DETECTED NOT DETECTED Final   Candida albicans NOT DETECTED NOT DETECTED Final   Candida auris NOT DETECTED NOT DETECTED Final   Candida glabrata NOT DETECTED NOT DETECTED Final   Candida krusei NOT DETECTED NOT DETECTED  Final   Candida parapsilosis NOT DETECTED NOT DETECTED Final   Candida tropicalis NOT DETECTED NOT DETECTED Final   Cryptococcus neoformans/gattii NOT DETECTED NOT DETECTED Final   CTX-M ESBL NOT DETECTED NOT DETECTED Final   Carbapenem resistance IMP NOT DETECTED NOT DETECTED Final   Carbapenem resistance KPC NOT DETECTED NOT DETECTED Final   Carbapenem resistance NDM NOT DETECTED NOT DETECTED Final   Carbapenem resist OXA 48 LIKE NOT DETECTED NOT DETECTED Final   Carbapenem resistance VIM NOT DETECTED NOT DETECTED Final    Comment: Performed at [facility], [address] [place], [place] [address]  Resp Panel by RT-PCR (Flu A&B, Covid) Nasopharyngeal Swab     Status: None   Collection Time: 05/22/21 10:05 AM   Specimen: Nasopharyngeal Swab; Nasopharyngeal(NP) swabs in vial transport medium  Result Value Ref Range Status   SARS Coronavirus 2 by RT PCR NEGATIVE NEGATIVE Final    Comment: (NOTE) SARS-CoV-2 target nucleic acids are NOT DETECTED.  The SARS-CoV-2 RNA is generally detectable in upper respiratory specimens during the acute phase of infection. The lowest concentration of SARS-CoV-2 viral copies this assay can detect is 138 copies/mL. A negative result does not preclude SARS-Cov-2 infection and should not be used as the sole basis for treatment or other patient management decisions. A negative result may occur with  improper specimen collection/handling, submission of specimen other than nasopharyngeal swab, presence of viral mutation(s) within the areas targeted by this assay, and inadequate number of viral copies(<138 copies/mL). A negative result must be combined with clinical observations, patient history, and epidemiological information. The expected result is Negative.  Fact Sheet for Patients:  EntrepreneurPulse.com.au  Fact Sheet for Healthcare Providers:  IncredibleEmployment.be  This test is no t yet approved or  cleared by the [place] FDA and  has been authorized for detection and/or diagnosis of SARS-CoV-2 by FDA under an Emergency Use Authorization (EUA). This EUA will remain  in effect (meaning this test can be used) for the duration of the COVID-19 declaration under Section 564(b)(1) of  the Act, 21 U.S.C.section 360bbb-3(b)(1), unless the authorization is terminated  or revoked sooner.       Influenza A by PCR NEGATIVE NEGATIVE Final   Influenza B by PCR NEGATIVE NEGATIVE Final    Comment: (NOTE) The Xpert Xpress SARS-CoV-2/FLU/RSV plus assay is intended as an aid in the diagnosis of influenza from Nasopharyngeal swab specimens and should not be used as a sole basis for treatment. Nasal washings and aspirates are unacceptable for Xpert Xpress SARS-CoV-2/FLU/RSV testing.  Fact Sheet for Patients: EntrepreneurPulse.com.au  Fact Sheet for Healthcare Providers: IncredibleEmployment.be  This test is not yet approved or cleared by the [place] FDA and has been authorized for detection and/or diagnosis of SARS-CoV-2 by FDA under an Emergency Use Authorization (EUA). This EUA will remain in effect (meaning this test can be used) for the duration of the COVID-19 declaration under Section 564(b)(1) of the Act, 21 U.S.C. section 360bbb-3(b)(1), unless the authorization is terminated or revoked.  Performed at [facility], [address] [place], [place] [address]   Urine Culture     Status: Abnormal   Collection Time: 05/22/21 11:05 AM   Specimen: Urine, Catheterized  Result Value Ref Range Status   Specimen Description   Final    URINE, CATHETERIZED Performed at [place] [address], [place], [place] [address]    Special Requests   Final    NONE Performed at [facility], [place] [address] [place], [place] [address]    Culture >=100,000 COLONIES/mL KLEBSIELLA PNEUMONIAE (A)  Final    Report Status 05/25/2021 FINAL  Final   Organism ID, Bacteria KLEBSIELLA PNEUMONIAE (A)  Final      Susceptibility   Klebsiella pneumoniae - MIC*    AMPICILLIN >=32 RESISTANT Resistant     CEFAZOLIN <=4 SENSITIVE Sensitive     CEFEPIME <=0.12 SENSITIVE Sensitive     CEFTRIAXONE <=0.25 SENSITIVE Sensitive     CIPROFLOXACIN <=0.25 SENSITIVE Sensitive     GENTAMICIN <=1 SENSITIVE Sensitive     IMIPENEM <=0.25 SENSITIVE Sensitive     NITROFURANTOIN <=16 SENSITIVE Sensitive     TRIMETH/SULFA <=20 SENSITIVE Sensitive     AMPICILLIN/SULBACTAM 4 SENSITIVE Sensitive     PIP/TAZO <=4 SENSITIVE Sensitive     * >=100,000 COLONIES/mL KLEBSIELLA PNEUMONIAE  Culture, blood (routine x 2)     Status: None (Preliminary result)   Collection Time: 05/25/21 12:14 PM   Specimen: BLOOD  Result Value Ref Range Status   Specimen Description   Final    BLOOD RIGHT ANTECUBITAL Performed at [facility] [address], [place], [place] [address]    Special Requests   Final    BOTTLES DRAWN AEROBIC ONLY Blood Culture adequate volume Performed at [facility] [address], [place], [place] [address]    Culture   Final    NO GROWTH 2 DAYS Performed at [facility] [address], [place], [place] [address]    Report Status PENDING  Incomplete  Culture, blood (routine x 2)     Status: None (Preliminary result)   Collection Time: 05/25/21 12:14 PM   Specimen: BLOOD  Result Value Ref Range Status   Specimen Description   Final    BLOOD LEFT ANTECUBITAL Performed at [facility] [address] [place], [place] [address]    Special Requests   Final    BOTTLES DRAWN AEROBIC ONLY Blood Culture adequate volume Performed at [facility], [place] [address], [place], [place] [address]    Culture   Final    NO GROWTH 2 DAYS Performed at [place] [address] [place],  [address]    Report Status PENDING   Incomplete     Labs: Basic Metabolic Panel: Recent Labs  Lab 05/23/21 0528 05/24/21 0439 05/25/21 0428 05/26/21 0510 05/27/21 0502  NA 139 138 137 139 139  K 4.0 3.7 3.7 3.9 3.7  CL 106 106 105 105 104  CO2 '25 24 24 25 27  '$ GLUCOSE 184* 238* 175* 162* 150*  BUN '16 15 15 18 16  '$ CREATININE 0.90 0.79 0.92 0.76 0.77  CALCIUM 8.8* 8.6* 8.2* 8.5* 8.2*  MG  --  1.6* 2.0  --  1.8   Liver Function Tests: Recent Labs  Lab 05/22/21 0910 05/23/21 0528  AST 18 24  ALT 16 22  ALKPHOS 67 68  BILITOT 0.9 1.1  PROT 7.0 6.0*  ALBUMIN 3.3* 2.7*   No results for input(s): LIPASE, AMYLASE in the last 168 hours. No results for input(s): AMMONIA in the last 168 hours. CBC: Recent Labs  Lab 05/22/21 0910 05/22/21 1934 05/23/21 0528 05/24/21 0439 05/25/21 0428 05/26/21 0510 05/27/21 0502  WBC 22.9*   < > 18.6* 13.1* 9.9 9.6 8.8  NEUTROABS 18.5*  --   --  10.5* 7.4  --   --   HGB 12.8*   < > 11.7* 10.6* 10.2* 10.8* 10.7*  HCT 38.5*   < > 35.2* 32.0* 30.1* 32.0* 32.2*  MCV 93.2   < > 93.6 92.8 91.8 91.4 92.5  PLT 290   < > 194 194 204 221 252   < > = values in this interval not displayed.   Cardiac Enzymes: No results for input(s): CKTOTAL, CKMB, CKMBINDEX, TROPONINI in the last 168 hours. BNP: BNP (last 3 results) No results for input(s): BNP in the last 8760 hours.  ProBNP (last 3 results) No results for input(s): PROBNP in the last 8760 hours.  CBG: Recent Labs  Lab 05/25/21 1122 05/25/21 1637 05/25/21 2059 05/26/21 0740 05/26/21 1700  GLUCAP 192* 175* 148* 180* 137*       Signed:  Irine Seal MD.  [facility] 05/27/2021, 4:10 PM

## 2021-05-27 NOTE — Plan of Care (Signed)

## 2021-05-27 NOTE — Progress Notes (Signed)
Provided and discussed discharge instructions. Addressed all questions and concerns. IV removed intact.  ?Craig Jensen N Lucciana Head ? ?

## 2021-05-27 NOTE — Plan of Care (Signed)

## 2021-05-30 LAB — CULTURE, BLOOD (ROUTINE X 2)
Culture: NO GROWTH
Culture: NO GROWTH
Special Requests: ADEQUATE
Special Requests: ADEQUATE

## 2021-06-04 LAB — GLUCOSE, CAPILLARY
Glucose-Capillary: 143 mg/dL — ABNORMAL HIGH (ref 70–99)
Glucose-Capillary: 164 mg/dL — ABNORMAL HIGH (ref 70–99)
Glucose-Capillary: 215 mg/dL — ABNORMAL HIGH (ref 70–99)
